# Patient Record
Sex: Female | Born: 1980
Health system: Southern US, Community
[De-identification: ages and names within clinical notes are randomized; demographics above are authoritative.]

## PROBLEM LIST (undated history)

## (undated) DIAGNOSIS — F419 Anxiety disorder, unspecified: Secondary | ICD-10-CM

## (undated) HISTORY — DX: Anxiety disorder, unspecified: F41.9

---

## 2014-01-10 DIAGNOSIS — O09521 Supervision of elderly multigravida, first trimester: Secondary | ICD-10-CM | POA: Insufficient documentation

## 2014-01-10 DIAGNOSIS — Z3491 Encounter for supervision of normal pregnancy, unspecified, first trimester: Secondary | ICD-10-CM | POA: Insufficient documentation

## 2014-01-10 DIAGNOSIS — Z87891 Personal history of nicotine dependence: Secondary | ICD-10-CM | POA: Insufficient documentation

## 2014-01-10 LAB — HM HIV SCREENING LAB: HM HIV Screening: NEGATIVE

## 2014-01-11 DIAGNOSIS — O26891 Other specified pregnancy related conditions, first trimester: Secondary | ICD-10-CM

## 2014-01-11 DIAGNOSIS — Z6791 Unspecified blood type, Rh negative: Secondary | ICD-10-CM | POA: Insufficient documentation

## 2015-10-11 DIAGNOSIS — Z01419 Encounter for gynecological examination (general) (routine) without abnormal findings: Secondary | ICD-10-CM | POA: Diagnosis not present

## 2015-10-11 DIAGNOSIS — R232 Flushing: Secondary | ICD-10-CM | POA: Diagnosis not present

## 2015-10-11 DIAGNOSIS — Z6827 Body mass index (BMI) 27.0-27.9, adult: Secondary | ICD-10-CM | POA: Diagnosis not present

## 2015-10-11 DIAGNOSIS — Z1151 Encounter for screening for human papillomavirus (HPV): Secondary | ICD-10-CM | POA: Diagnosis not present

## 2015-10-15 LAB — HM PAP SMEAR

## 2015-11-25 ENCOUNTER — Ambulatory Visit
Admission: EM | Admit: 2015-11-25 | Discharge: 2015-11-25 | Disposition: A | Discharge status: Home / Self Care | Payer: BLUE CROSS/BLUE SHIELD | Attending: Family Medicine | Admitting: Family Medicine

## 2015-11-25 ENCOUNTER — Encounter: Payer: Self-pay | Admitting: Gynecology

## 2015-11-25 DIAGNOSIS — R059 Cough, unspecified: Secondary | ICD-10-CM

## 2015-11-25 DIAGNOSIS — R05 Cough: Secondary | ICD-10-CM

## 2015-11-25 DIAGNOSIS — R112 Nausea with vomiting, unspecified: Secondary | ICD-10-CM

## 2015-11-25 LAB — RAPID STREP SCREEN (MED CTR MEBANE ONLY): STREPTOCOCCUS, GROUP A SCREEN (DIRECT): NEGATIVE

## 2015-11-25 LAB — RAPID INFLUENZA A&B ANTIGENS (ARMC ONLY): INFLUENZA A (ARMC): NEGATIVE

## 2015-11-25 LAB — RAPID INFLUENZA A&B ANTIGENS: Influenza B (ARMC): NEGATIVE

## 2015-11-25 MED ORDER — ONDANSETRON 8 MG PO TBDP
8.0000 mg | ORAL_TABLET | Freq: Once | ORAL | Status: AC
  Administered 2015-11-25: 8 mg via ORAL

## 2015-11-25 MED ORDER — ONDANSETRON 4 MG PO TBDP: 4.0000 mg | ORAL_TABLET | Freq: Three times a day (TID) | ORAL | 0 refills | Status: DC | PRN

## 2015-11-25 NOTE — ED Triage Notes (Signed)
Patient c/o 2-3 days sore throat. Per patient body aches and throws up today.

## 2015-11-25 NOTE — Discharge Instructions (Signed)
Use the zofran as directed.  If you continue to have nausea and vomiting please go to the ED.  Take care  Dr. Adriana Simasook

## 2015-11-25 NOTE — ED Provider Notes (Signed)
[facility]    CSN: [id number] Arrival date & time: 11/25/15  1521  History   Chief Complaint Chief Complaint  Patient presents with  . Generalized Body Aches   HPI 35 year old female presents with the planes of body aches, chills, nausea, vomiting. She also has cough.  Patient states that she's had a cough for the past 2 days. Her daughter has been sick. Her husband is also been sick with similar symptoms. She states that this morning she developed body aches, chills, and nausea/vomiting. She states that she feels generally poor. She states she's had 2 episodes of emesis. No other associated symptoms. No fever. Symptoms are severe. No other complaints at this time. They are concerned about influenza.  History reviewed. No pertinent past medical history.  History reviewed. No pertinent surgical history.  OB History    No data available     Home Medications    Prior to Admission medications   Medication Sig Start Date End Date Taking? Authorizing Provider  ondansetron (ZOFRAN-ODT) 4 MG disintegrating tablet Take 1 tablet (4 mg total) by mouth every 8 (eight) hours as needed for nausea or vomiting. 11/25/15   Tommie SamsJayce G Pansey Pinheiro, DO   Family History History reviewed. No pertinent family history.  Social History Social History  Substance Use Topics  . Smoking status: Never Smoker  . Smokeless tobacco: Never Used  . Alcohol use No   Allergies   Review of patient's allergies indicates no known allergies.  Review of Systems Review of Systems  Constitutional: Positive for chills and fatigue.  Respiratory: Positive for cough.   Gastrointestinal: Positive for nausea and vomiting.  Musculoskeletal: Positive for myalgias.  All other systems reviewed and are negative.  Physical Exam Triage Vital Signs ED Triage Vitals  Enc Vitals Group     BP 11/25/15 1543 98/60     Pulse Rate 11/25/15 1543 (!) 105     Resp 11/25/15 1543 16     Temp 11/25/15 1543 99.9 F (37.7 C)       Temp src --      SpO2 11/25/15 1543 99 %     Weight 11/25/15 1544 170 lb (77.1 kg)     Height 11/25/15 1544 5\' 6"  (1.676 m)     Head Circumference --      Peak Flow --      Pain Score --      Pain Loc --      Pain Edu? --      Excl. in GC? --    Updated Vital Signs BP 98/60 (BP Location: Left Arm)   Pulse (!) 105   Temp 99.9 F (37.7 C)   Resp 16   Ht 5\' 6"  (1.676 m)   Wt 170 lb (77.1 kg)   LMP 11/25/2015   SpO2 99%   BMI 27.44 kg/m   Physical Exam  Constitutional: She is oriented to person, place, and time.  Appears fatigued, pale. No acute respiratory distress.  HENT:  Head: Normocephalic and atraumatic.  Mouth/Throat: Oropharynx is clear and moist.  Eyes: Conjunctivae are normal.  Neck: Neck supple.  Cardiovascular: Regular rhythm.  Tachycardia present.   Pulmonary/Chest: Effort normal.  Abdominal: Soft. She exhibits no distension. There is no tenderness. There is no rebound and no guarding.  Musculoskeletal: Normal range of motion.  Neurological: She is alert and oriented to person, place, and time.  Skin: Skin is warm. No rash noted.  Psychiatric: She has a normal mood and affect.  Vitals  reviewed.  UC Treatments / Results  Labs (all labs ordered are listed, but only abnormal results are displayed) Labs Reviewed  RAPID STREP SCREEN (NOT AT [facility])  RAPID INFLUENZA A&B ANTIGENS ([facility] ONLY)  CULTURE, GROUP A STREP [facility])    EKG  EKG Interpretation None      Radiology No results found.  Procedures Procedures (including critical care time)  Medications Ordered in UC Medications  ondansetron (ZOFRAN-ODT) disintegrating tablet 8 mg (8 mg Oral Given 11/25/15 1603)   Initial Impression / Assessment and Plan / UC Course  I have reviewed the triage vital signs and the nursing notes.  Pertinent labs & imaging results that were available during my care of the patient were reviewed by me and considered in my medical decision making (see chart for  details).  Clinical Course  35 year old female presents with complaints of cough, body aches, chills, nausea/vomiting. Her husband and daughter also sick. This is likely viral in origin. Patient given Zofran today. Discharging home on Zofran. Advised to increase fluid intake. If she's unable to keep things down, she needs to go to ED for IV fluids.  Final Clinical Impressions(s) / UC Diagnoses   Final diagnoses:  Non-intractable vomiting with nausea, unspecified vomiting type  Cough   New Prescriptions Discharge Medication List as of 11/25/2015  4:19 PM    START taking these medications   Details  ondansetron (ZOFRAN-ODT) 4 MG disintegrating tablet Take 1 tablet (4 mg total) by mouth every 8 (eight) hours as needed for nausea or vomiting., Starting Sun 11/25/2015, Print         Tommie Sams, DO 11/25/15 1652

## 2015-11-28 ENCOUNTER — Telehealth: Payer: Self-pay | Admitting: Emergency Medicine

## 2015-11-28 LAB — CULTURE, GROUP A STREP (THRC)

## 2015-11-28 NOTE — Telephone Encounter (Signed)
Patient was notified of her throat culture result.  Patient states that she is feeling much.  Patient was instructed to follow-up here or with her PCP if her symptoms worsen.  Patient verbalized understanding.

## 2015-12-11 DIAGNOSIS — J209 Acute bronchitis, unspecified: Secondary | ICD-10-CM | POA: Diagnosis not present

## 2016-01-02 DIAGNOSIS — R05 Cough: Secondary | ICD-10-CM | POA: Diagnosis not present

## 2016-01-04 DIAGNOSIS — R05 Cough: Secondary | ICD-10-CM | POA: Diagnosis not present

## 2016-02-07 DIAGNOSIS — D485 Neoplasm of uncertain behavior of skin: Secondary | ICD-10-CM | POA: Diagnosis not present

## 2016-02-07 DIAGNOSIS — D1801 Hemangioma of skin and subcutaneous tissue: Secondary | ICD-10-CM | POA: Diagnosis not present

## 2016-02-07 DIAGNOSIS — L814 Other melanin hyperpigmentation: Secondary | ICD-10-CM | POA: Diagnosis not present

## 2016-02-07 DIAGNOSIS — L570 Actinic keratosis: Secondary | ICD-10-CM | POA: Diagnosis not present

## 2016-02-22 DIAGNOSIS — J069 Acute upper respiratory infection, unspecified: Secondary | ICD-10-CM | POA: Diagnosis not present

## 2016-04-29 DIAGNOSIS — Z79899 Other long term (current) drug therapy: Secondary | ICD-10-CM | POA: Diagnosis not present

## 2016-04-29 DIAGNOSIS — F419 Anxiety disorder, unspecified: Secondary | ICD-10-CM | POA: Diagnosis not present

## 2016-04-29 DIAGNOSIS — B351 Tinea unguium: Secondary | ICD-10-CM | POA: Diagnosis not present

## 2016-10-13 DIAGNOSIS — Z6829 Body mass index (BMI) 29.0-29.9, adult: Secondary | ICD-10-CM | POA: Diagnosis not present

## 2016-10-13 DIAGNOSIS — Z01419 Encounter for gynecological examination (general) (routine) without abnormal findings: Secondary | ICD-10-CM | POA: Diagnosis not present

## 2016-12-02 DIAGNOSIS — F419 Anxiety disorder, unspecified: Secondary | ICD-10-CM | POA: Diagnosis not present

## 2016-12-02 DIAGNOSIS — Z79899 Other long term (current) drug therapy: Secondary | ICD-10-CM | POA: Diagnosis not present

## 2017-03-03 ENCOUNTER — Encounter: Payer: Self-pay | Admitting: *Deleted

## 2017-03-03 ENCOUNTER — Other Ambulatory Visit: Payer: Self-pay

## 2017-03-03 ENCOUNTER — Ambulatory Visit
Admission: EM | Admit: 2017-03-03 | Discharge: 2017-03-03 | Disposition: A | Discharge status: Home / Self Care | Payer: BLUE CROSS/BLUE SHIELD | Attending: Family Medicine | Admitting: Family Medicine

## 2017-03-03 DIAGNOSIS — R05 Cough: Secondary | ICD-10-CM

## 2017-03-03 DIAGNOSIS — J069 Acute upper respiratory infection, unspecified: Secondary | ICD-10-CM

## 2017-03-03 DIAGNOSIS — J0100 Acute maxillary sinusitis, unspecified: Secondary | ICD-10-CM

## 2017-03-03 DIAGNOSIS — B9789 Other viral agents as the cause of diseases classified elsewhere: Secondary | ICD-10-CM | POA: Diagnosis not present

## 2017-03-03 LAB — RAPID STREP SCREEN (MED CTR MEBANE ONLY): Streptococcus, Group A Screen (Direct): NEGATIVE

## 2017-03-03 MED ORDER — FLUTICASONE PROPIONATE 50 MCG/ACT NA SUSP: 2.0000 | Freq: Every day | NASAL | 0 refills | Status: DC

## 2017-03-03 MED ORDER — HYDROCOD POLST-CPM POLST ER 10-8 MG/5ML PO SUER: 5.0000 mL | Freq: Two times a day (BID) | ORAL | 0 refills | Status: DC

## 2017-03-03 MED ORDER — AMOXICILLIN-POT CLAVULANATE 875-125 MG PO TABS: 1.0000 | ORAL_TABLET | Freq: Two times a day (BID) | ORAL | 0 refills | Status: DC

## 2017-03-03 MED ORDER — BENZONATATE 200 MG PO CAPS: ORAL_CAPSULE | ORAL | 0 refills | Status: DC

## 2017-03-03 NOTE — ED Provider Notes (Signed)
[facility]    CSN: [id number] Arrival date & time: 03/03/17  [id number]     History   Chief Complaint Chief Complaint  Patient presents with  . Sore Throat  . Cough  . Nasal Congestion      HPI [name] is a 37 y.o. female.   HPI  37 year old female presents with symptoms of sore throat cough nasal congestion and sinus pressure had  for 3 days.  He has been having sinus pressure with radiating upper dental pain.  She has been using Nettie pot and salt water rinses with mild relief.  She is unable to sleep last night because of the coughing persisted all night long.  Child is in daycare most likely the carrier of the illness.  Her daughter had just improved from croup last week.  She has a temperature of 99 degrees today.  O2 sats are 100% on room air.        History reviewed. No pertinent past medical history.  There are no active problems to display for this patient.   History reviewed. No pertinent surgical history.  OB History    No data available       Home Medications    Prior to Admission medications   Medication Sig Start Date End Date Taking? Authorizing Provider  amoxicillin-clavulanate (AUGMENTIN) 875-125 MG tablet Take 1 tablet by mouth every 12 (twelve) hours. 03/10/17   Lutricia Feiloemer, Tavarus Poteete P, PA-C  benzonatate (TESSALON) 200 MG capsule Take one cap TID PRN cough 03/03/17   Lutricia Feiloemer, Jaquilla Woodroof P, PA-C  chlorpheniramine-HYDROcodone [facility](TUSSIONEX PENNKINETIC ER) 10-8 MG/5ML SUER Take 5 mLs by mouth 2 (two) times daily. 03/03/17   Lutricia Feiloemer, Melbert Botelho P, PA-C  fluticasone (FLONASE) 50 MCG/ACT nasal spray Place 2 sprays into both nostrils daily. 03/03/17   Lutricia Feiloemer, Zadaya Cuadra P, PA-C    Family History Family History  Problem Relation Age of Onset  . Atrial fibrillation Father   . COPD Father     Social History Social History   Tobacco Use  . Smoking status: Never Smoker  . Smokeless tobacco: Never Used  Substance Use Topics  . Alcohol use: No  . Drug  use: No     Allergies   Patient has no known allergies.   Review of Systems Review of Systems  Constitutional: Positive for activity change, chills and fatigue.  HENT: Positive for congestion, postnasal drip, rhinorrhea, sinus pressure and sinus pain.   Respiratory: Positive for cough.   All other systems reviewed and are negative.    Physical Exam Triage Vital Signs ED Triage Vitals  Enc Vitals Group     BP 03/03/17 0951 105/69     Pulse Rate 03/03/17 0951 87     Resp 03/03/17 0951 18     Temp 03/03/17 0951 99 F (37.2 C)     Temp Source 03/03/17 0951 Oral     SpO2 03/03/17 0951 100 %     Weight 03/03/17 0952 170 lb (77.1 kg)     Height 03/03/17 0952 5\' 6"  (1.676 m)     Head Circumference --      Peak Flow --      Pain Score 03/03/17 0952 0     Pain Loc --      Pain Edu? --      Excl. in GC? --    No data found.  Updated Vital Signs BP 105/69 (BP Location: Left Arm)   Pulse 87   Temp 99 F (37.2 C) (Oral)  Resp 18   Ht 5\' 6"  (1.676 m)   Wt 170 lb (77.1 kg)   LMP 02/02/2017   SpO2 100%   BMI 27.44 kg/m   Visual Acuity Right Eye Distance:   Left Eye Distance:   Bilateral Distance:    Right Eye Near:   Left Eye Near:    Bilateral Near:     Physical Exam  Constitutional: She is oriented to person, place, and time. She appears well-developed and well-nourished.  HENT:  Head: Normocephalic.  Right Ear: Hearing, tympanic membrane and ear canal normal.  Left Ear: Hearing, tympanic membrane and ear canal normal.  Mouth/Throat: Uvula is midline, oropharynx is clear and moist and mucous membranes are normal. No oral lesions. No uvula swelling. No oropharyngeal exudate, posterior oropharyngeal edema, posterior oropharyngeal erythema or tonsillar abscesses. Tonsils are 0 on the right. Tonsils are 0 on the left. No tonsillar exudate.  There is moderate tenderness over the maxillary sinuses bilaterally.  Eyes: Pupils are equal, round, and reactive to light.    Neck: Normal range of motion.  Pulmonary/Chest: Effort normal and breath sounds normal.  Lymphadenopathy:    She has no cervical adenopathy.  Neurological: She is alert and oriented to person, place, and time.  Skin: Skin is warm and dry.  Psychiatric: She has a normal mood and affect. Her behavior is normal.  Nursing note and vitals reviewed.    UC Treatments / Results  Labs (all labs ordered are listed, but only abnormal results are displayed) Labs Reviewed  RAPID STREP SCREEN (NOT AT [facility])  CULTURE, GROUP A STREP [facility])    EKG  EKG Interpretation None       Radiology No results found.  Procedures Procedures (including critical care time)  Medications Ordered in UC Medications - No data to display   Initial Impression / Assessment and Plan / UC Course  I have reviewed the triage vital signs and the nursing notes.  Pertinent labs & imaging results that were available during my care of the patient were reviewed by me and considered in my medical decision making (see chart for details).     Plan: 1. Test/x-ray results and diagnosis reviewed with patient 2. rx as per orders; risks, benefits, potential side effects reviewed with patient 3. Recommend supportive treatment with rest and fluids.  Continue with Nettie pot and Flonase use.  Discontinue Lorazepam at nighttime for sleep but instead substitute with the Tussionex.  Will prescribe Augmentin for sinusitis in 7 days if she is not improving. 4. F/u prn if symptoms worsen or don't improve   Final Clinical Impressions(s) / UC Diagnoses   Final diagnoses:  Acute maxillary sinusitis, recurrence not specified  Upper respiratory tract infection, unspecified type  Viral URI with cough    ED Discharge Orders        Ordered    amoxicillin-clavulanate (AUGMENTIN) 875-125 MG tablet  Every 12 hours     03/03/17 1052    benzonatate (TESSALON) 200 MG capsule     03/03/17 1052    fluticasone (FLONASE) 50 MCG/ACT  nasal spray  Daily     03/03/17 1052    chlorpheniramine-HYDROcodone (TUSSIONEX PENNKINETIC ER) 10-8 MG/5ML SUER  2 times daily     03/03/17 1052       Controlled Substance Prescriptions [place] Controlled Substance Registry consulted? Not Applicable   Lutricia Feil, PA-C 03/03/17 2012

## 2017-03-03 NOTE — ED Triage Notes (Signed)
PAtient started having symptoms of core throat, cough, and nasal congestion 3 days ago.

## 2017-03-03 NOTE — Medical Student Note (Signed)
[facility] Provider Student Note For educational purposes for Medical, PA and NP students only and not part of the legal medical record.   CSN: [id number] Arrival date & time: 03/03/17  [id number]     History   Chief Complaint Chief Complaint  Patient presents with  . Sore Throat  . Cough  . Nasal Congestion    HPI Laura Dodson is a 37 y.o. female.  HPI  37 year old white female presents with chest congestion, clear productive cough, frontal headache, sinus pressure with radiating upper dental pain, sore throat x 3 days. Denies ear pain, visual changes, fever/chills, N/V/D, bowel changes, urinary symptoms, abdominal pain. Patient's daughter had croup 1 week ago and goes to daycare. Did sinus rinses and salt gargle rinses with mild relief. Has not tried OTC cold/flu meds or pain relievers. Has trouble sleeping at night from nasal congestion and took a 0.25 mg Lorazepam for sleeping aid. She has this prescription from her former PCP whose practice has closed and pt is looking for a new PCP. Did not receive influenza vaccine this year.   History reviewed. No pertinent past medical history.  There are no active problems to display for this patient.   History reviewed. No pertinent surgical history.  OB History    No data available       Home Medications    Prior to Admission medications   Medication Sig Start Date End Date Taking? Authorizing Provider  ondansetron (ZOFRAN-ODT) 4 MG disintegrating tablet Take 1 tablet (4 mg total) by mouth every 8 (eight) hours as needed for nausea or vomiting. 11/25/15   Tommie Samsook, Jayce G, DO    Family History Family History  Problem Relation Age of Onset  . Atrial fibrillation Father   . COPD Father     Social History Social History   Tobacco Use  . Smoking status: Never Smoker  . Smokeless tobacco: Never Used  Substance Use Topics  . Alcohol use: No  . Drug use: No     Allergies   Patient has no known  allergies.   Review of Systems Review of Systems  Constitutional: Negative for chills, fatigue and fever.  HENT: Positive for congestion, rhinorrhea, sinus pressure and sore throat. Negative for ear discharge, ear pain, sinus pain and trouble swallowing.   Eyes: Negative.   Respiratory: Positive for cough and chest tightness. Negative for shortness of breath, wheezing and stridor.   Cardiovascular: Negative for chest pain and palpitations.  Gastrointestinal: Negative for abdominal pain, constipation, diarrhea, nausea and vomiting.  Genitourinary: Negative for frequency and urgency.  Musculoskeletal: Negative for arthralgias and myalgias.  Skin: Negative for rash.  Neurological: Positive for headaches. Negative for weakness.  Hematological: Negative for adenopathy.     Physical Exam Updated Vital Signs BP 105/69 (BP Location: Left Arm)   Pulse 87   Temp 99 F (37.2 C) (Oral)   Resp 18   Ht 5\' 6"  (1.676 m)   Wt 77.1 kg   LMP 02/02/2017   SpO2 100%   BMI 27.44 kg/m   Physical Exam  Constitutional: Vital signs are normal. She appears well-developed and well-nourished.  Non-toxic appearance. No distress.  HENT:  Head: Normocephalic and atraumatic.  Right Ear: Tympanic membrane and ear canal normal.  Left Ear: Tympanic membrane and ear canal normal.  Mouth/Throat: Uvula is midline and mucous membranes are normal. No oral lesions. No uvula swelling. Posterior oropharyngeal erythema present. No oropharyngeal exudate, posterior oropharyngeal edema or tonsillar abscesses. Tonsils  are 0 on the right. Tonsils are 0 on the left. No tonsillar exudate.  Mild frontal and maxillary tenderness to palpation   Eyes: Conjunctivae are normal.  Neck: Neck supple.  Cardiovascular: Normal rate, regular rhythm, normal heart sounds and intact distal pulses.  No murmur heard. Pulmonary/Chest: Effort normal and breath sounds normal. No stridor. No respiratory distress. She has no wheezes. She has no  rhonchi. She has no rales. She exhibits no tenderness.  Abdominal: Soft. There is no tenderness.  Musculoskeletal: She exhibits no edema.  Lymphadenopathy:    She has no cervical adenopathy.  Neurological: She is alert.  Skin: Skin is warm and dry.  Psychiatric: She has a normal mood and affect.  Nursing note and vitals reviewed.    ED Treatments / Results  Labs (all labs ordered are listed, but only abnormal results are displayed) Labs Reviewed  RAPID STREP SCREEN (NOT AT [facility])  CULTURE, GROUP A STREP [facility])    EKG  EKG Interpretation None       Radiology No results found.  Procedures Procedures (including critical care time)  Medications Ordered in ED Medications - No data to display   Initial Impression / Assessment and Plan / ED Course  I have reviewed the triage vital signs and the nursing notes.  Pertinent labs & imaging results that were available during my care of the patient were reviewed by me and considered in my medical decision making (see chart for details).    37 year old white non-tachycardic, afebrile, non-toxic appearing female presents with clear productive cough, sinus pressure, and congestion x 3 days. Signs and symptoms are consistent with a viral URI and could be an early developing bacterial sinusitis. Prescribed Tessalon Perles, Flonase, Tussionex, and OTC Mucinex. Continue sinus rinses and NSAIDs for pain relief. Will prescribe Augmentin 500 mg BID for her to fill if symptoms do not improve within the next 10 days. Patient agrees with plan.    Final Clinical Impressions(s) / ED Diagnoses   Final diagnoses:  None    New Prescriptions New Prescriptions   No medications on file

## 2017-03-06 LAB — CULTURE, GROUP A STREP (THRC)

## 2017-03-11 DIAGNOSIS — B356 Tinea cruris: Secondary | ICD-10-CM | POA: Diagnosis not present

## 2017-03-11 DIAGNOSIS — L820 Inflamed seborrheic keratosis: Secondary | ICD-10-CM | POA: Diagnosis not present

## 2017-03-27 ENCOUNTER — Ambulatory Visit
Admission: EM | Admit: 2017-03-27 | Discharge: 2017-03-27 | Disposition: A | Discharge status: Home / Self Care | Payer: BLUE CROSS/BLUE SHIELD | Attending: Family Medicine | Admitting: Family Medicine

## 2017-03-27 DIAGNOSIS — Z20828 Contact with and (suspected) exposure to other viral communicable diseases: Secondary | ICD-10-CM | POA: Diagnosis not present

## 2017-03-27 DIAGNOSIS — Z1159 Encounter for screening for other viral diseases: Secondary | ICD-10-CM

## 2017-03-27 LAB — RAPID INFLUENZA A&B ANTIGENS (ARMC ONLY)
INFLUENZA A (ARMC): NEGATIVE
INFLUENZA B (ARMC): NEGATIVE

## 2017-03-27 MED ORDER — OSELTAMIVIR PHOSPHATE 75 MG PO CAPS: 75.0000 mg | ORAL_CAPSULE | Freq: Every day | ORAL | 0 refills | Status: AC

## 2017-03-27 NOTE — ED Triage Notes (Signed)
Pt was in direct contact with her sister and eating after her and she was diagnosed with the flu and would like a flu test. She has no symptoms at all.

## 2017-03-27 NOTE — Discharge Instructions (Signed)
Take medication as prescribed. Rest. Drink plenty of fluids.  ° °Follow up with your primary care physician this week as needed. Return to Urgent care for new or worsening concerns.  ° °

## 2017-03-27 NOTE — ED Provider Notes (Signed)
[facility] ____________________________________________  Time seen: Approximately 738 PM  I have reviewed the triage vital signs and the nursing notes.   HISTORY  Chief Complaint Influenza   HPI Laura Dodson is a 37 y.o. female presenting for evaluation of influenza. States she shared a salad yesterday with her sister in law that tested positive for influenza today, though remains asymptomatic. States her sister in law was tested as her son was sick. Patient reports that she currently feels fine. Denies cough, congestion, fevers, body aches, or other complaints. States wants preventative tamiflu if not sick.   Denies chest pain, shortness of breath, abdominal pain, or rash. Denies recent sickness. Denies recent antibiotic use. Denies renal insufficiency.   Patient's last menstrual period was 03/27/2017 (exact date).denies pregnancy.    History reviewed. No pertinent past medical history.  There are no active problems to display for this patient.   History reviewed. No pertinent surgical history.   No current facility-administered medications for this encounter.   Current Outpatient Medications:  .  amoxicillin-clavulanate (AUGMENTIN) 875-125 MG tablet, Take 1 tablet by mouth every 12 (twelve) hours., Disp: 20 tablet, Rfl: 0 .  benzonatate (TESSALON) 200 MG capsule, Take one cap TID PRN cough, Disp: 30 capsule, Rfl: 0 .  chlorpheniramine-HYDROcodone (TUSSIONEX PENNKINETIC ER) 10-8 MG/5ML SUER, Take 5 mLs by mouth 2 (two) times daily., Disp: 115 mL, Rfl: 0 .  fluticasone (FLONASE) 50 MCG/ACT nasal spray, Place 2 sprays into both nostrils daily., Disp: 16 g, Rfl: 0 .  oseltamivir (TAMIFLU) 75 MG capsule, Take 1 capsule (75 mg total) by mouth daily for 10 days., Disp: 10 capsule, Rfl: 0  Allergies Patient has no known allergies.  Family History  Problem Relation Age of Onset  . Atrial fibrillation Father   . COPD Father     Social History Social History    Tobacco Use  . Smoking status: Never Smoker  . Smokeless tobacco: Never Used  Substance Use Topics  . Alcohol use: No  . Drug use: No    Review of Systems Constitutional: No fever/chills ENT: No sore throat. Cardiovascular: Denies chest pain. Respiratory: Denies shortness of breath. Gastrointestinal: No abdominal pain.   Musculoskeletal: Negative for back pain. Skin: Negative for rash.   ____________________________________________   PHYSICAL EXAM:  VITAL SIGNS: ED Triage Vitals  Enc Vitals Group     BP 03/27/17 1850 (!) 120/54     Pulse Rate 03/27/17 1850 67     Resp 03/27/17 1850 18     Temp 03/27/17 1850 98.6 F (37 C)     Temp Source 03/27/17 1850 Oral     SpO2 03/27/17 1850 100 %     Weight --      Height --      Head Circumference --      Peak Flow --      Pain Score 03/27/17 1852 0     Pain Loc --      Pain Edu? --      Excl. in GC? --     Constitutional: Alert and oriented. Well appearing and in no acute distress. Eyes: Conjunctivae are normal. Head: Atraumatic. No sinus tenderness to palpation. No swelling. No erythema.  Ears: no erythema, normal TMs bilaterally.   Nose:No nasal congestion  Mouth/Throat: Mucous membranes are moist. No pharyngeal erythema. No tonsillar swelling or exudate.  Neck: No stridor.  No cervical spine tenderness to palpation. Hematological/Lymphatic/Immunilogical: No cervical lymphadenopathy. Cardiovascular: Normal rate, regular rhythm. Grossly normal heart sounds.  Good peripheral circulation. Respiratory: Normal respiratory effort.  No retractions. No wheezes, rales or rhonchi. Good air movement.  Musculoskeletal: Ambulatory with steady gait. Neurologic:  Normal speech and language. No gait instability. Skin:  Skin appears warm, dry. Psychiatric: Mood and affect are normal. Speech and behavior are normal.  ___________________________________________   LABS (all labs ordered are listed, but only abnormal results are  displayed)  Labs Reviewed  RAPID INFLUENZA A&B ANTIGENS ([facility] ONLY)   ____________________________________________   PROCEDURES Procedures    INITIAL IMPRESSION / ASSESSMENT AND PLAN / ED COURSE  Pertinent labs & imaging results that were available during my care of the patient were reviewed by me and considered in my medical decision making (see chart for details).  Well-appearing patient.  No acute distress.  Influenza test negative. Discussed typical viral transmission and symptomatic time periods. Will start on oral tamiflu, counseled if symptomatic to adjust dose to bid. Encouraged supportive care.   Discussed follow up with Primary care physician this week. Discussed follow up and return parameters including no resolution or any worsening concerns. Patient verbalized understanding and agreed to plan.   ____________________________________________   FINAL CLINICAL IMPRESSION(S) / ED DIAGNOSES  Final diagnoses:  Exposure to influenza     ED Discharge Orders        Ordered    oseltamivir (TAMIFLU) 75 MG capsule  Daily     03/27/17 1928       Note: This dictation was prepared with Dragon dictation along with smaller phrase technology. Any transcriptional errors that result from this process are unintentional.         Renford Dills, NP 03/27/17 2018

## 2017-03-30 ENCOUNTER — Telehealth: Payer: Self-pay

## 2017-03-30 NOTE — Telephone Encounter (Signed)
Called to follow up with patient since visit here at [facility]. Spoke with pt, reports improvement.Patient instructed to call back with any questions or concerns. MAH  

## 2017-04-20 DIAGNOSIS — D485 Neoplasm of uncertain behavior of skin: Secondary | ICD-10-CM | POA: Diagnosis not present

## 2017-04-20 DIAGNOSIS — L814 Other melanin hyperpigmentation: Secondary | ICD-10-CM | POA: Diagnosis not present

## 2017-05-25 ENCOUNTER — Ambulatory Visit: Payer: BLUE CROSS/BLUE SHIELD | Admitting: Nurse Practitioner

## 2017-05-25 ENCOUNTER — Ambulatory Visit
Admission: RE | Admit: 2017-05-25 | Discharge: 2017-05-25 | Disposition: A | Discharge status: Home / Self Care | Payer: BLUE CROSS/BLUE SHIELD | Source: Ambulatory Visit | Attending: Nurse Practitioner | Admitting: Nurse Practitioner

## 2017-05-25 ENCOUNTER — Encounter: Payer: Self-pay | Admitting: Nurse Practitioner

## 2017-05-25 ENCOUNTER — Other Ambulatory Visit: Payer: Self-pay

## 2017-05-25 VITALS — BP 108/69 | HR 59 | Temp 98.4°F | Ht 65.0 in | Wt 169.8 lb

## 2017-05-25 DIAGNOSIS — G8929 Other chronic pain: Secondary | ICD-10-CM | POA: Diagnosis not present

## 2017-05-25 DIAGNOSIS — Z7689 Persons encountering health services in other specified circumstances: Secondary | ICD-10-CM | POA: Diagnosis not present

## 2017-05-25 DIAGNOSIS — M546 Pain in thoracic spine: Secondary | ICD-10-CM | POA: Insufficient documentation

## 2017-05-25 DIAGNOSIS — R0781 Pleurodynia: Secondary | ICD-10-CM | POA: Diagnosis not present

## 2017-05-25 DIAGNOSIS — F419 Anxiety disorder, unspecified: Secondary | ICD-10-CM

## 2017-05-25 MED ORDER — HYDROXYZINE HCL 25 MG PO TABS: 12.5000 mg | ORAL_TABLET | Freq: Three times a day (TID) | ORAL | 1 refills | Status: DC | PRN

## 2017-05-25 NOTE — Patient Instructions (Addendum)
Leafy RoJessica Jagger,   Thank you for coming in to clinic today.  1. For your nail, continue using yuour tea tree oil or you can use Res-Q ointment from [employer].    2. For your back aching - Start taking Tylenol extra strength 1 to 2 tablets every 6-8 hours for aches or fever/chills for next few days as needed.  Do not take more than 3,000 mg in 24 hours from all medicines.  May take Ibuprofen as well if tolerated 200-400mg  every 8 hours as needed. May alternate tylenol and ibuprofen in same day. - Use heat and ice.  Apply this for 15 minutes at a time 6-8 times per day.   - Muscle rub with lidocaine, lidocaine patch, Biofreeze, or tiger balm for topical pain relief.  Avoid using this with heat and ice to avoid burns.  Please schedule a follow-up appointment with Wilhelmina McardleLauren Kyana Aicher, AGNP. Return in about 3 months (around 08/24/2017) for anxiety.  If you have any other questions or concerns, please feel free to call the clinic or send a message through MyChart. You may also schedule an earlier appointment if necessary.  You will receive a survey after today's visit either digitally by e-mail or paper by [employer] mail. Your experiences and feedback matter to us.  Please respond so we know how we are doing as we provide care for you.   Wilhelmina McardleLauren Najae Filsaime, DNP, AGNP-BC Adult Gerontology Nurse Practitioner [facility], [facility]

## 2017-05-25 NOTE — Progress Notes (Signed)
Subjective:    Patient ID: Laura Dodson, female    DOB: 15-Mar-1980, 37 y.o.   MRN: [id number]  Laura Dodson is a 37 y.o. female presenting on 05/25/2017 for Establish Care   HPI  Establish Care New Provider Pt [facility] - pt has her own records with her today Dermatology screening hx melanoma - receives regularly.   Anxiety Occasional panic, but is more overwhelming anxiety.  She notices this more in very loud, busy, chaotic environments and usually also with PMDD.   - Mild ADD - real estate exam x 3 times before passing.  - Has random fear in early 20s and had Zoloft x 3 months and had sensation of "feeling like she is in her own little bubble." - Family history of anxiety.  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 Took in Nov for pre-cursor to flare, February - took 1 also.  None since.  Requests refill today.  Thoracic Back pain Pt notes left-sided thoracic back pain near ribs. She notes no injury, but may have hurt it coughing.  She is not getting adequate relief with tylenol or ibuprofen currently.    Past Medical History:  Diagnosis Date  . Anxiety    History reviewed. No pertinent surgical history. Social History   Socioeconomic History  . Marital status: Married    Spouse name: Not on file  . Number of children: Not on file  . Years of education: Not on file  . Highest education level: Associate degree: occupational, Scientist, product/process developmenttechnical, or vocational program  Occupational History  . Not on file  Social Needs  . Financial resource strain: Not on file  . Food insecurity:    Worry: Not on file    Inability: Not on file  . Transportation needs:    Medical: Not on file    Non-medical: Not on file  Tobacco Use  . Smoking status: Former Smoker    Packs/day: 0.50    Years: 15.00    Pack years: 7.50    Last attempt to quit: 2012    Years since quitting: 7.3  . Smokeless  tobacco: Never Used  Substance and Sexual Activity  . Alcohol use: Yes    Comment: 2-3  . Drug use: No  . Sexual activity: Not on file  Lifestyle  . Physical activity:    Days per week: Not on file    Minutes per session: Not on file  . Stress: Not at all  Relationships  . Social connections:    Talks on phone: Not on file    Gets together: Not on file    Attends religious service: Not on file    Active member of club or organization: Not on file    Attends meetings of clubs or organizations: Not on file    Relationship status: Not on file  . Intimate partner violence:    Fear of current or ex partner: No    Emotionally abused: No    Physically abused: No    Forced sexual activity: No  Other Topics Concern  . Not on file  Social History Narrative  . Not on file   Family History  Problem Relation Age of Onset  . Healthy Mother   . Squamous cell carcinoma Mother   . Atrial fibrillation Father   . COPD Father   . Heart failure Father   . Healthy Brother   . Healthy  Daughter   . Squamous cell carcinoma Maternal Aunt   . Melanoma Maternal Grandmother   . Lung cancer Maternal Grandmother   . Cancer Paternal Grandfather   . Healthy Brother    Current Outpatient Medications on File Prior to Visit  Medication Sig  . valACYclovir (VALTREX) 500 MG tablet Take 500 mg by mouth 2 (two) times daily as needed.  . fluticasone (FLONASE) 50 MCG/ACT nasal spray Place 2 sprays into both nostrils daily. (Patient not taking: Reported on 05/25/2017)   No current facility-administered medications on file prior to visit.     Review of Systems  Constitutional: Negative.   HENT: Negative.   Eyes: Negative.   Respiratory: Negative.   Cardiovascular: Negative.   Gastrointestinal: Negative.   Endocrine: Negative.   Genitourinary: Negative.   Musculoskeletal: Negative.   Skin: Negative.   Allergic/Immunologic: Negative.   Neurological: Negative.   Hematological: Negative.     Psychiatric/Behavioral: The patient is nervous/anxious.   All other systems reviewed and are negative.  Per HPI unless specifically indicated above     Objective:    BP 108/69 (BP Location: Right Arm, Patient Position: Sitting, Cuff Size: Normal)   Pulse (!) 59   Temp 98.4 F (36.9 C) (Oral)   Ht 5\' 5"  (1.651 m)   Wt 169 lb 12.8 oz (77 kg)   LMP 05/19/2017   BMI 28.26 kg/m   Wt Readings from Last 3 Encounters:  05/25/17 169 lb 12.8 oz (77 kg)  03/03/17 170 lb (77.1 kg)  11/25/15 170 lb (77.1 kg)    Physical Exam  Constitutional: She is oriented to person, place, and time. She appears well-developed and well-nourished. No distress.  HENT:  Head: Normocephalic and atraumatic.  Cardiovascular: Normal rate, regular rhythm, S1 normal, S2 normal, normal heart sounds and intact distal pulses.  Pulmonary/Chest: Effort normal and breath sounds normal. No respiratory distress.  Musculoskeletal:  Thoracic Back Inspection: Normal appearance, no spinal deformity, symmetrical. Palpation: No tenderness over spinous processes. Bilateral thoracic paraspinal muscles non tender and without hypertonicity/spasm. ROM: Full active ROM forward flex / back extension, rotation L/R without discomfort Special Testing: Seated SLR negative for radicular pain bilaterally.  Strength: Bilateral hip flex/ext 5/5, knee flex/ext 5/5, ankle dorsiflex/plantarflex 5/5 Neurovascular: intact distal sensation to light touch   Neurological: She is alert and oriented to person, place, and time.  Skin: Skin is warm and dry.  Third digit nail left foot with thickening and discoloration.  Psychiatric: She has a normal mood and affect. Her behavior is normal. Judgment and thought content normal. Her speech is rapid and/or pressured. Cognition and memory are normal. She expresses no homicidal and no suicidal ideation. She expresses no suicidal plans and no homicidal plans. She is communicative.  Vitals reviewed.    Results for orders placed or performed during the hospital encounter of 03/27/17  Rapid Influenza A&B Antigens ([facility] only)  Result Value Ref Range   Influenza A ([facility]) NEGATIVE NEGATIVE   Influenza B ([facility]) NEGATIVE NEGATIVE      Assessment & Plan:   Problem List Items Addressed This Visit    None    Visit Diagnoses    Anxiety    -  Primary Pt presents with s/sx of panic disorder with social anxiety.  Pt admits she also has ADHD but no psychology evaluation.  Plan: 1. Recommended stress management. 2. START hydroxyzine 25 mg tablet tid prn anxiety/panic. 3. Consider psychology eval if needing treatment for ADHD.  Defer for now and manage  anxiety. 4. Followup 3 months.   Relevant Medications   hydrOXYzine (ATARAX/VISTARIL) 25 MG tablet   Chronic left-sided thoracic back pain     No known etiology.  Pt is concerned for rib fracture or other bony abnormality.  Requests Xrays today.  Cannot duplicate pain with palpation today.  Likely self-limited muscle strain.  Plan: 1. Thoracic spine and rib Xrays without fracture. 2. Treat with OTC pain meds (acetaminophen and ibuprofen).  Discussed alternate dosing and max dosing. 3. Apply heat and/or ice to affected area. 4. May also apply a muscle rub with lidocaine or lidocaine patch after heat or ice. 5. Follow up 4-6 weeks prn    Relevant Orders   DG Thoracic Spine 2 View (Completed)   DG Ribs Unilateral Left (Completed)   Encounter to establish care     Past medical, family, and surgical history reviewed w/ pt.  Records reviewed.        Meds ordered this encounter  Medications  . hydrOXYzine (ATARAX/VISTARIL) 25 MG tablet    Sig: Take 0.5-1 tablets (12.5-25 mg total) by mouth 3 (three) times daily as needed for anxiety.    Dispense:  30 tablet    Refill:  1    Order Specific Question:   Supervising Provider    Answer:   Smitty Cords [[id number]]     Follow up plan: Return in about 3 months (around 08/24/2017) for  anxiety.  Wilhelmina Mcardle, DNP, AGPCNP-BC Adult Gerontology Primary Care Nurse Practitioner [facility] 06/17/2017, 8:46 PM

## 2017-08-31 ENCOUNTER — Encounter: Payer: Self-pay | Admitting: Nurse Practitioner

## 2017-09-07 ENCOUNTER — Other Ambulatory Visit: Payer: Self-pay

## 2017-09-07 ENCOUNTER — Ambulatory Visit (INDEPENDENT_AMBULATORY_CARE_PROVIDER_SITE_OTHER): Payer: BLUE CROSS/BLUE SHIELD | Admitting: Nurse Practitioner

## 2017-09-07 ENCOUNTER — Encounter: Payer: Self-pay | Admitting: Nurse Practitioner

## 2017-09-07 VITALS — BP 92/59 | HR 64 | Temp 98.4°F | Ht 65.0 in | Wt 161.2 lb

## 2017-09-07 DIAGNOSIS — F410 Panic disorder [episodic paroxysmal anxiety] without agoraphobia: Secondary | ICD-10-CM | POA: Diagnosis not present

## 2017-09-07 DIAGNOSIS — S90222A Contusion of left lesser toe(s) with damage to nail, initial encounter: Secondary | ICD-10-CM

## 2017-09-07 DIAGNOSIS — B351 Tinea unguium: Secondary | ICD-10-CM | POA: Diagnosis not present

## 2017-09-07 MED ORDER — LORAZEPAM 0.5 MG PO TABS
0.5000 mg | ORAL_TABLET | Freq: Every evening | ORAL | 5 refills | Status: DC | PRN
Start: 2017-09-07 — End: 2018-05-04

## 2017-09-07 NOTE — Progress Notes (Signed)
Subjective:    Patient ID: Laura Dodson, female    DOB: 1980-12-20, 37 y.o.   MRN: [id number]  Laura Dodson is a 37 y.o. female presenting on 09/07/2017 for Toe Injury (left 3rd toe injury x 1 yrs ago. Now a blood blister x  2 weeks )   HPI Fungal nail Injured left 3rd toenail 1 year ago.  Has had fungal nail appearance chronically since that time.  Approximately 3 weeks ago, patient had pedicure.  Since that time, nail has been very painful to mild trauma, is lifting some, has blood beneath nail near nail cuticle, and is not improving.  Would like resolution.  Situational anxiety and panic - has used about 1 per month. This month has been more stressful, so has used 2 this month.  Is concerned for running out and presents today to renew prescription that is currently expired. - Has tried Xanax and Clonopin in past with significant drowsiness and less action on anxiety. - Lorazepam helps significantly for calming and is well tolerated without side effects. Has used 1/2 dose in past, but half doesn't have good effect on symptoms. - Patient takes this when she gets very irritable, agitated, annoyance that is "difficult to shake."  Overwhelming sense of being "short fused/short-tempered."  Racing thoughts/rumination occur when self help strategies are used and patient knows she will have panic attack if she doesn't take lorazepam.  Self help strategies include: 2 minutes to escape a situation and calm down (self talk, deep breathing), running for 30 minutes or 20 minute workout.  Patient admits she likely has mild ADD also.  It is at these times that she feels most overloaded and cannot continue to function when she has higher levels of anxiety. - No panic attack in many years, but uses Lorazepam to stop these from occurring.  She was having anxiety attacks frequently in her 28s.  GAD 7 : Generalized Anxiety Score 09/07/2017  Nervous, Anxious, on Edge 0  Control/stop worrying 0  Worry too much -  different things 0  Trouble relaxing 3  Restless 0  Easily annoyed or irritable 0  Afraid - awful might happen 0  Total GAD 7 Score 3  Anxiety Difficulty Not difficult at all   Depression screen [facility] 2/9 09/07/2017 05/25/2017  Decreased Interest 0 0  Down, Depressed, Hopeless 0 0  PHQ - 2 Score 0 0  Altered sleeping 0 -  Tired, decreased energy 0 -  Change in appetite 0 -  Feeling bad or failure about yourself  0 -  Trouble concentrating 3 -  Moving slowly or fidgety/restless 0 -  Suicidal thoughts 0 -  PHQ-9 Score 3 -  Difficult doing work/chores Not difficult at all -    Social History   Tobacco Use  . Smoking status: Former Smoker    Packs/day: 0.50    Years: 15.00    Pack years: 7.50    Last attempt to quit: 2012    Years since quitting: 7.5  . Smokeless tobacco: Never Used  Substance Use Topics  . Alcohol use: Yes    Comment: 2-3  . Drug use: No    Review of Systems  Constitutional: Negative for fatigue and unexpected weight change.  Respiratory: Negative for shortness of breath.   Cardiovascular: Negative for chest pain and palpitations.  Gastrointestinal: Negative for abdominal pain, constipation, diarrhea, nausea and vomiting.  Endocrine: Negative for polydipsia.  Musculoskeletal: Negative for back pain and myalgias.  Skin: Negative.   Neurological:  Negative for dizziness, seizures and headaches.  Psychiatric/Behavioral: Positive for agitation and decreased concentration. Negative for hallucinations, self-injury, sleep disturbance and suicidal ideas. The patient is nervous/anxious (intermittently).    Per HPI unless specifically indicated above     Objective:    BP (!) 92/59 (BP Location: Right Arm, Patient Position: Sitting, Cuff Size: Normal)   Pulse 64   Temp 98.4 F (36.9 C) (Oral)   Ht 5\' 5"  (1.651 m)   Wt 161 lb 3.2 oz (73.1 kg)   BMI 26.83 kg/m   Wt Readings from Last 3 Encounters:  09/07/17 161 lb 3.2 oz (73.1 kg)  05/25/17 169 lb 12.8 oz  (77 kg)  03/03/17 170 lb (77.1 kg)    Physical Exam  Constitutional: She is oriented to person, place, and time. She appears well-developed and well-nourished. No distress.  HENT:  Head: Normocephalic and atraumatic.  Cardiovascular: Normal rate, regular rhythm, S1 normal, S2 normal, normal heart sounds and intact distal pulses.  Pulmonary/Chest: Effort normal and breath sounds normal. No respiratory distress.  Musculoskeletal:       Feet:  Neurological: She is alert and oriented to person, place, and time.  Skin: Skin is warm and dry. Capillary refill takes less than 2 seconds.  Psychiatric: She has a normal mood and affect. Her behavior is normal. Judgment and thought content normal.  Vitals reviewed.       Results for orders placed or performed during the hospital encounter of 03/27/17  Rapid Influenza A&B Antigens ([facility] only)  Result Value Ref Range   Influenza A ([facility]) NEGATIVE NEGATIVE   Influenza B ([facility]) NEGATIVE NEGATIVE      Assessment & Plan:   Problem List Items Addressed This Visit    None    Visit Diagnoses    Fungal nail infection    -  Primary   Relevant Orders   Ambulatory referral to Podiatry   Subungual hematoma of toe of left foot, initial encounter       Relevant Orders   Ambulatory referral to Podiatry   Severe anxiety with panic       Relevant Medications   LORazepam (ATIVAN) 0.5 MG tablet      # Fungal nail infection and subungual hematoma third toe left foot Stable, chronic fungal nail infection with hematoma below nail on third toe near cuticle.  Appeared after pedicure and is likely related to that cosmetic procedure with cuticle clipping.  Remains painful and bothersome to patient. - Referral to podiatry.  No acute process that requires treatment today in clinic.  Consider release of hematoma vs removal of nail plate was discussed as possible option with patient.  Patient would like to preserve nail ability to regrow if possible.  Await  further recommendations or other options from podiatrist. - Advised patient to avoid cuticle removal at any future pedicures/manicures to prevent injuries and infections. - Followup as needed  # Anxiety Stable chronic anxiety with panic attacks.  Patient uses lorazepam sparingly for halting of anxiety that would lead to panic attack.  Patient is well adapted with self care strategies and non-pharm measures to prevent panic and control anxiety. - Continue self care regularly. - Continue lorazepam 0.5 mg daily prn for anxiety and panic attack. - Followup 6 months.  Meds ordered this encounter  Medications  . LORazepam (ATIVAN) 0.5 MG tablet    Sig: Take 1 tablet (0.5 mg total) by mouth at bedtime as needed for anxiety.    Dispense:  15 tablet  Refill:  5    Order Specific Question:   Supervising Provider    Answer:   Smitty CordsKARAMALEGOS, ALEXANDER J [[id number]]    Follow up plan: Return in about 6 months (around 03/10/2018) for annual physical.  Laura McardleLauren Gwendolen Hewlett, DNP, AGPCNP-BC Adult Gerontology Primary Care Nurse Practitioner [facility] 09/07/2017, 12:15 PM

## 2017-09-07 NOTE — Patient Instructions (Addendum)
Laura Dodson,   Thank you for coming in to clinic today.  1. Continue lorazepam 0.5 mg once daily as needed for anxiety. - Followup 6 months.  2. Referral to [facility] for your toe treatment.  They will call you.  Please schedule a follow-up appointment with Wilhelmina McardleLauren Letica Giaimo, AGNP. Return in about 6 months (around 03/10/2018) for annual physical.  If you have any other questions or concerns, please feel free to call the clinic or send a message through MyChart. You may also schedule an earlier appointment if necessary.  You will receive a survey after today's visit either digitally by e-mail or paper by [employer] mail. Your experiences and feedback matter to us.  Please respond so we know how we are doing as we provide care for you.   Wilhelmina McardleLauren Mykeal Carrick, DNP, AGNP-BC Adult Gerontology Nurse Practitioner [facility], [facility]

## 2017-09-23 ENCOUNTER — Encounter: Payer: Self-pay | Admitting: Podiatry

## 2017-09-23 ENCOUNTER — Ambulatory Visit: Payer: BLUE CROSS/BLUE SHIELD | Admitting: Podiatry

## 2017-09-23 DIAGNOSIS — B351 Tinea unguium: Secondary | ICD-10-CM | POA: Diagnosis not present

## 2017-09-23 DIAGNOSIS — L603 Nail dystrophy: Secondary | ICD-10-CM

## 2017-09-23 NOTE — Progress Notes (Signed)
  Subjective:  Patient ID: Laura Dodson, female    DOB: [birth year],  MRN: [id number] HPI Chief Complaint  Patient presents with  . Nail Problem    Patient presents today with nail fungus of 3rd left toe x 1 year.  She reports at times it becomes inflammed and very sore around nail bed.  She states she has been using vicks vapor rub the past several days which has helped with the redness and pain    37 y.o. female presents with the above complaint.   ROS: Denies fever chills nausea vomiting muscle aches and pains.  Past Medical History:  Diagnosis Date  . Anxiety    No past surgical history on file.  Current Outpatient Medications:  .  fluticasone (FLONASE) 50 MCG/ACT nasal spray, Place 2 sprays into both nostrils daily., Disp: 16 g, Rfl: 0 .  LORazepam (ATIVAN) 0.5 MG tablet, Take 1 tablet (0.5 mg total) by mouth at bedtime as needed for anxiety., Disp: 15 tablet, Rfl: 5 .  valACYclovir (VALTREX) 500 MG tablet, Take 500 mg by mouth 2 (two) times daily as needed., Disp: , Rfl:   No Known Allergies Review of Systems Objective:  There were no vitals filed for this visit.  General: Well developed, nourished, in no acute distress, alert and oriented x3   Dermatological: Skin is warm, dry and supple bilateral. Nails x 10 are well maintained; remaining integument appears unremarkable at this time. There are no open sores, no preulcerative lesions, no rash or signs of infection present.  Thick dystrophic possibly mycotic nail third digit left foot.  Vascular: Dorsalis Pedis artery and Posterior Tibial artery pedal pulses are 2/4 bilateral with immedate capillary fill time. Pedal hair growth present. No varicosities and no lower extremity edema present bilateral.   Neruologic: Grossly intact via light touch bilateral. Vibratory intact via tuning fork bilateral. Protective threshold with Semmes Wienstein monofilament intact to all pedal sites bilateral. Patellar and Achilles deep tendon  reflexes 2+ bilateral. No Babinski or clonus noted bilateral.   Musculoskeletal: No gross boney pedal deformities bilateral. No pain, crepitus, or limitation noted with foot and ankle range of motion bilateral. Muscular strength 5/5 in all groups tested bilateral.  Gait: Unassisted, Nonantalgic.    Radiographs:  None taken  Assessment & Plan:   Assessment: Nail dystrophy third toe left foot  Plan: Samples of the nail were taken today to be sent for pathologic evaluation.     Max T. [place], [place]

## 2017-10-05 ENCOUNTER — Telehealth: Payer: Self-pay | Admitting: Podiatry

## 2017-10-05 NOTE — Telephone Encounter (Signed)
I spoke with patient and informed her that Dr. Al CorpusHyatt would like her to come into office to discuss [place] results before giving medication.  She verbalized understanding and was sent to scheduling for sooner appt than 11/04/17

## 2017-10-05 NOTE — Telephone Encounter (Signed)
Patient called and wanted to know if she can start using medication for her toes since her appointment isn't until 11/04/17

## 2017-10-12 DIAGNOSIS — R102 Pelvic and perineal pain: Secondary | ICD-10-CM | POA: Diagnosis not present

## 2017-10-12 DIAGNOSIS — Z6826 Body mass index (BMI) 26.0-26.9, adult: Secondary | ICD-10-CM | POA: Diagnosis not present

## 2017-10-20 DIAGNOSIS — Z6826 Body mass index (BMI) 26.0-26.9, adult: Secondary | ICD-10-CM | POA: Diagnosis not present

## 2017-10-20 DIAGNOSIS — Z01419 Encounter for gynecological examination (general) (routine) without abnormal findings: Secondary | ICD-10-CM | POA: Diagnosis not present

## 2017-11-04 ENCOUNTER — Encounter: Payer: Self-pay | Admitting: Podiatry

## 2017-11-04 ENCOUNTER — Ambulatory Visit: Payer: BLUE CROSS/BLUE SHIELD | Admitting: Podiatry

## 2017-11-04 DIAGNOSIS — Z79899 Other long term (current) drug therapy: Secondary | ICD-10-CM

## 2017-11-04 MED ORDER — TERBINAFINE HCL 250 MG PO TABS: 250.0000 mg | ORAL_TABLET | Freq: Every day | ORAL | 0 refills | Status: DC

## 2017-11-04 NOTE — Progress Notes (Signed)
She presents today for follow-up of her nail fungus.  States that she is doing quite well but would like her toe to look better.  Objective: Pathology report demonstrates a saprophytic infection.  Assessment: Onychomycosis.  Plan: Discussed the pros and cons of oral therapy today she would like to go ahead and start oral therapy Lamisil 250 mg tablets 1 p.o. daily x30 and requested a liver profile.  I will follow-up with her in 1 month.  We discussed the possible side effects and reactions she is to notify us with any concerns.

## 2017-11-05 LAB — HEPATIC FUNCTION PANEL
ALT: 14 IU/L (ref 0–32)
AST: 24 IU/L (ref 0–40)
Albumin: 4.6 g/dL (ref 3.5–5.5)
Alkaline Phosphatase: 46 IU/L (ref 39–117)
BILIRUBIN TOTAL: 0.5 mg/dL (ref 0.0–1.2)
BILIRUBIN, DIRECT: 0.14 mg/dL (ref 0.00–0.40)
TOTAL PROTEIN: 7.2 g/dL (ref 6.0–8.5)

## 2017-11-06 ENCOUNTER — Telehealth: Payer: Self-pay | Admitting: *Deleted

## 2017-11-06 NOTE — Telephone Encounter (Signed)
-----   Message from Elinor ParkinsonMax T Hyatt, [place] sent at 11/05/2017  6:44 AM EDT ----- Blood work looks perfect and may continue mediction

## 2017-11-06 NOTE — Telephone Encounter (Signed)
Left message informing pt of Dr. Hyatt's review of results and orders. 

## 2017-11-23 DIAGNOSIS — Z6826 Body mass index (BMI) 26.0-26.9, adult: Secondary | ICD-10-CM | POA: Diagnosis not present

## 2017-11-23 DIAGNOSIS — Z1289 Encounter for screening for malignant neoplasm of other sites: Secondary | ICD-10-CM | POA: Diagnosis not present

## 2017-11-25 ENCOUNTER — Other Ambulatory Visit: Payer: Self-pay

## 2017-11-25 ENCOUNTER — Ambulatory Visit
Admission: EM | Admit: 2017-11-25 | Discharge: 2017-11-25 | Disposition: A | Discharge status: Home / Self Care | Payer: BLUE CROSS/BLUE SHIELD | Attending: Family Medicine | Admitting: Family Medicine

## 2017-11-25 ENCOUNTER — Encounter: Payer: Self-pay | Admitting: Emergency Medicine

## 2017-11-25 DIAGNOSIS — J029 Acute pharyngitis, unspecified: Secondary | ICD-10-CM | POA: Diagnosis not present

## 2017-11-25 DIAGNOSIS — R05 Cough: Secondary | ICD-10-CM | POA: Diagnosis not present

## 2017-11-25 DIAGNOSIS — J069 Acute upper respiratory infection, unspecified: Secondary | ICD-10-CM | POA: Diagnosis not present

## 2017-11-25 LAB — RAPID STREP SCREEN (MED CTR MEBANE ONLY): STREPTOCOCCUS, GROUP A SCREEN (DIRECT): NEGATIVE

## 2017-11-25 MED ORDER — HYDROCOD POLST-CPM POLST ER 10-8 MG/5ML PO SUER: 5.0000 mL | Freq: Every evening | ORAL | 0 refills | Status: DC | PRN

## 2017-11-25 MED ORDER — BENZONATATE 100 MG PO CAPS: 100.0000 mg | ORAL_CAPSULE | Freq: Three times a day (TID) | ORAL | 0 refills | Status: DC | PRN

## 2017-11-25 NOTE — Discharge Instructions (Addendum)
Take medication as prescribed. Rest. Drink plenty of fluids.  ° °Follow up with your primary care physician this week as needed. Return to Urgent care for new or worsening concerns.  ° °

## 2017-11-25 NOTE — ED Provider Notes (Signed)
[facility] ____________________________________________  Time seen: Approximately 12:11 PM  I have reviewed the triage vital signs and the nursing notes.   HISTORY  Chief Complaint Sore Throat (APPT) and Cough   HPI Laura Dodson is a 37 y.o. female presenting for evaluation of runny nose, nasal congestion, sore throat and a cough that started on Monday.  Reports low-grade fevers, T-max 99.1 this morning.  States that she did take Tylenol prior to arrival.  States her daughter has been sick in the last few days, diagnosed with croup this past weekend.  States sore throat feels very raw and irritating.  Nasal congestion, with cough.  States cough does intermittently disrupt sleep.  States cough is productive of yellowish-green sputum.  Denies hemoptysis.  No complaint chest pain, shortness of breath, abdominal pain, extremity pain or joint swelling.  Has been taking over-the-counter Tylenol, Echinacea.  Denies other aggravating alleviating factors.  Denies recent sickness.  Reports otherwise feels well.  Denies current pregnancy.  Galen Manila, NP: PCP  Past Medical History:  Diagnosis Date  . Anxiety     Patient Active Problem List   Diagnosis Date Noted  . Rh negative status during pregnancy in first trimester, antepartum 01/11/2014  . Former smoker 01/10/2014  . Multigravida of advanced maternal age in first trimester 01/10/2014  . Supervision of normal pregnancy in first trimester 01/10/2014    History reviewed. No pertinent surgical history.   No current facility-administered medications for this encounter.   Current Outpatient Medications:  .  LORazepam (ATIVAN) 0.5 MG tablet, Take 1 tablet (0.5 mg total) by mouth at bedtime as needed for anxiety., Disp: 15 tablet, Rfl: 5 .  terbinafine (LAMISIL) 250 MG tablet, Take 1 tablet (250 mg total) by mouth daily., Disp: 30 tablet, Rfl: 0 .  valACYclovir (VALTREX) 500 MG tablet, Take 500 mg by mouth 2 (two)  times daily as needed., Disp: , Rfl:  .  benzonatate (TESSALON PERLES) 100 MG capsule, Take 1 capsule (100 mg total) by mouth 3 (three) times daily as needed for cough., Disp: 15 capsule, Rfl: 0 .  chlorpheniramine-HYDROcodone (TUSSIONEX PENNKINETIC ER) 10-8 MG/5ML SUER, Take 5 mLs by mouth at bedtime as needed for cough. do not drive or operate machinery while taking as can cause drowsiness., Disp: 50 mL, Rfl: 0  Allergies Patient has no known allergies.  Family History  Problem Relation Age of Onset  . Healthy Mother   . Squamous cell carcinoma Mother   . Atrial fibrillation Father   . COPD Father   . Heart failure Father   . Healthy Brother   . Healthy Daughter   . Squamous cell carcinoma Maternal Aunt   . Melanoma Maternal Grandmother   . Lung cancer Maternal Grandmother   . Cancer Paternal Grandfather   . Healthy Brother     Social History Social History   Tobacco Use  . Smoking status: Former Smoker    Packs/day: 0.50    Years: 15.00    Pack years: 7.50    Last attempt to quit: 2012    Years since quitting: 7.7  . Smokeless tobacco: Never Used  Substance Use Topics  . Alcohol use: Yes    Comment: 2-3  . Drug use: No    Review of Systems Constitutional: Possible subjective fevers.  ENT: positive sore throat. Cardiovascular: Denies chest pain. Respiratory: Denies shortness of breath. Gastrointestinal: No abdominal pain.   Musculoskeletal: Negative for back pain. Skin: Negative for rash.  ____________________________________________   PHYSICAL  EXAM:  VITAL SIGNS: ED Triage Vitals  Enc Vitals Group     BP 11/25/17 1105 104/64     Pulse Rate 11/25/17 1105 76     Resp 11/25/17 1105 16     Temp 11/25/17 1105 98.7 F (37.1 C)     Temp Source 11/25/17 1105 Oral     SpO2 11/25/17 1105 99 %     Weight 11/25/17 1106 155 lb (70.3 kg)     Height 11/25/17 1106 5\' 6"  (1.676 m)     Head Circumference --      Peak Flow --      Pain Score 11/25/17 1106 7      Pain Loc --      Pain Edu? --      Excl. in GC? --     Constitutional: Alert and oriented. Well appearing and in no acute distress. Eyes: Conjunctivae are normal. PERRL.  Head: Atraumatic. No sinus tenderness to palpation. No swelling. No erythema.  Ears: no erythema, normal TMs bilaterally.   Nose:Nasal congestion with clear rhinorrhea  Mouth/Throat: Mucous membranes are moist. Mild pharyngeal erythema. No tonsillar swelling or exudate.  Neck: No stridor.  No cervical spine tenderness to palpation. Hematological/Lymphatic/Immunilogical: No cervical lymphadenopathy. Cardiovascular: Normal rate, regular rhythm. Grossly normal heart sounds.  Good peripheral circulation. Respiratory: Normal respiratory effort.  No retractions. No wheezes, rales or rhonchi. Good air movement.  Musculoskeletal: Ambulatory with steady gait.  Neurologic:  Normal speech and language. No gait instability. Skin:  Skin appears warm, dry and intact. No rash noted. Psychiatric: Mood and affect are normal. Speech and behavior are normal.  ___________________________________________   LABS (all labs ordered are listed, but only abnormal results are displayed)  Labs Reviewed  RAPID STREP SCREEN ([facility] ONLY)  CULTURE, GROUP A STREP [facility])    PROCEDURES Procedures     INITIAL IMPRESSION / ASSESSMENT AND PLAN / ED COURSE  Pertinent labs & imaging results that were available during my care of the patient were reviewed by me and considered in my medical decision making (see chart for details).  Well-appearing patient.  No acute distress.  Quick strep negative, will culture.  Suspect viral upper respiratory infection with cough.  Discussed over-the-counter Sudafed use, will Rx PRN Tessalon Perls and PRN Keflex.  Encourage rest, fluids, supportive care.Discussed indication, risks and benefits of medications with patient.  Discussed follow up with Primary care physician this week as needed. Discussed  follow up and return parameters including no resolution or any worsening concerns. Patient verbalized understanding and agreed to plan.   ____________________________________________   FINAL CLINICAL IMPRESSION(S) / ED DIAGNOSES  Final diagnoses:  Upper respiratory tract infection, unspecified type     ED Discharge Orders         Ordered    chlorpheniramine-HYDROcodone (TUSSIONEX PENNKINETIC ER) 10-8 MG/5ML SUER  At bedtime PRN     11/25/17 1151    benzonatate (TESSALON PERLES) 100 MG capsule  3 times daily PRN     11/25/17 1151           Note: This dictation was prepared with Dragon dictation along with smaller phrase technology. Any transcriptional errors that result from this process are unintentional.         Renford Dills, NP 11/25/17 1216

## 2017-11-25 NOTE — ED Triage Notes (Signed)
Patient in today c/o sore throat, productive cough (green) and nasal congestion. Patient has had chills, fever of 99.1. Patient has had body aches as well. Patient has been taking Tylenol and hot showers.

## 2017-11-28 LAB — CULTURE, GROUP A STREP (THRC)

## 2017-11-30 ENCOUNTER — Ambulatory Visit: Payer: BLUE CROSS/BLUE SHIELD | Admitting: Podiatry

## 2017-12-23 ENCOUNTER — Ambulatory Visit: Payer: BLUE CROSS/BLUE SHIELD | Admitting: Podiatry

## 2017-12-23 ENCOUNTER — Encounter: Payer: Self-pay | Admitting: Podiatry

## 2017-12-23 ENCOUNTER — Encounter

## 2017-12-23 DIAGNOSIS — L603 Nail dystrophy: Secondary | ICD-10-CM

## 2017-12-23 DIAGNOSIS — Z79899 Other long term (current) drug therapy: Secondary | ICD-10-CM | POA: Diagnosis not present

## 2017-12-23 MED ORDER — TERBINAFINE HCL 250 MG PO TABS: 250.0000 mg | ORAL_TABLET | Freq: Every day | ORAL | 0 refills | Status: DC

## 2017-12-23 NOTE — Progress Notes (Signed)
She presents today for follow-up of her nail fungus.  She states that she had stopped taking the Lamisil for a few days because of stomach upset but has gone on to start and nearly complete the regimen.  She denies fever chills nausea vomiting muscle aches pains calf pain back pain chest pain shortness of breath other than a short period of nauseousness that she had prior.  She states that her toenail no longer hurts.  Objective: No change in the toenails as of yet in regards to the thickness and growth out.  Assessment: Onychomycosis long-term therapy with Lamisil.  Plan: Continue use of the Lamisil therapy request a liver profile and I will follow-up with her in 4 months we dispensed another prescription for 3 months of Lamisil.

## 2018-03-10 ENCOUNTER — Other Ambulatory Visit: Payer: Self-pay

## 2018-03-10 ENCOUNTER — Ambulatory Visit (INDEPENDENT_AMBULATORY_CARE_PROVIDER_SITE_OTHER): Payer: BLUE CROSS/BLUE SHIELD | Admitting: Nurse Practitioner

## 2018-03-10 ENCOUNTER — Encounter: Payer: Self-pay | Admitting: Nurse Practitioner

## 2018-03-10 VITALS — BP 100/59 | HR 57 | Temp 98.2°F | Ht 66.0 in | Wt 170.6 lb

## 2018-03-10 DIAGNOSIS — H538 Other visual disturbances: Secondary | ICD-10-CM | POA: Diagnosis not present

## 2018-03-10 DIAGNOSIS — Z0000 Encounter for general adult medical examination without abnormal findings: Secondary | ICD-10-CM | POA: Diagnosis not present

## 2018-03-10 DIAGNOSIS — R000 Tachycardia, unspecified: Secondary | ICD-10-CM | POA: Diagnosis not present

## 2018-03-10 DIAGNOSIS — Z131 Encounter for screening for diabetes mellitus: Secondary | ICD-10-CM

## 2018-03-10 NOTE — Patient Instructions (Addendum)
Laura Dodson,   Thank you for coming in to clinic today.  1. Continue work toward healthy lifestyle. - Keep target HR for your workout at 80% of your max which will be 145-158 beats per minute.    2.  You will be due for FASTING BLOOD WORK.  This means you should eat no food or drink after midnight.  Drink only water or coffee without cream/sugar Dodson the morning of your lab visit. - Please go ahead and schedule a "Lab Only" visit in the morning at the clinic for lab draw today. - Your results will be available about 2-3 days after blood draw.  If you have set up a MyChart account, you can can log in to MyChart online to view your results and a brief explanation. Also, we can discuss your results together at your next office visit if you would like.   Please schedule a follow-up appointment with Wilhelmina Mcardle, AGNP. Return in about 1 year (around 03/11/2019) for annual physical.  If you have any other questions or concerns, please feel free to call the clinic or send a message through MyChart. You may also schedule an earlier appointment if necessary.  You will receive a survey after today's visit either digitally by e-mail or paper by [employer]. Your experiences and feedback matter to [place].  Please respond so we know how we are doing as we provide care for you.  Wilhelmina Mcardle, DNP, AGNP-BC Adult Gerontology Nurse Practitioner [facility], [facility]

## 2018-03-10 NOTE — Progress Notes (Signed)
Subjective:    Patient ID: Laura Dodson, female    DOB: 02/11/1981, 38 y.o.   MRN: [id number]  Laura Dodson is a 38 y.o. female presenting on 03/10/2018 for Annual Exam and Migraine (vision headache, kaleidscope vision x 2 times only lasting about 15 minutes )   HPI Annual Physical Exam Patient has been feeling well.  They have acute concerns today. Sleeps 5 hours per night uninterrupted.  HEALTH MAINTENANCE: Weight/BMI: overweight  - has already reduced 5 lbs since new year Physical activity: new commitment to exercise, healthy eating Diet: had increased carbs, sweets during holidays.   Seatbelt: always Sunscreen: regularly PAP: last in 2015 - due 2020 with OB-GYN Mammogram: not due HIV: neg in past Optometry: up to date Dentistry: regular  VACCINES: Tetanus: 2015 Influenza: declines  Vision changes Patient with kaleidoscope vision.  Never had headache/pain with symptoms. Had done a 5K a couple months ago to complete the race.  Then had kaleidoscope vision for about 15-30 minutes.  Has been to optometrist and has started using readers.  Again after workout Monday, had kaleidoscope vision about 30 minutes after workout for 30 minutes.   Was drinking water, had no other symptoms.  - Brother has had similar symptoms with migraine aura without pain. - Racing HR occurred, but possibly due to anxiousness.  Notes she had some symptoms of racing HR after workout. Last HR after cool down was still in 130s.  Usually getting HR max of 170s-180 and cool down to 130s before leaving.  Resting HR usually low.   Past Medical History:  Diagnosis Date  . Anxiety    No past surgical history on file. Social History   Socioeconomic History  . Marital status: Married    Spouse name: Not on file  . Number of children: Not on file  . Years of education: Not on file  . Highest education level: Associate degree: occupational, Scientist, product/process development, or vocational program  Occupational History  . Not on  file  Social Needs  . Financial resource strain: Not on file  . Food insecurity:    Worry: Not on file    Inability: Not on file  . Transportation needs:    Medical: Not on file    Non-medical: Not on file  Tobacco Use  . Smoking status: Former Smoker    Packs/day: 0.50    Years: 15.00    Pack years: 7.50    Last attempt to quit: 2012    Years since quitting: 8.0  . Smokeless tobacco: Never Used  Substance and Sexual Activity  . Alcohol use: Yes    Comment: 2-3  . Drug use: No  . Sexual activity: Not on file  Lifestyle  . Physical activity:    Days per week: Not on file    Minutes per session: Not on file  . Stress: Not at all  Relationships  . Social connections:    Talks on phone: Not on file    Gets together: Not on file    Attends religious service: Not on file    Active member of club or organization: Not on file    Attends meetings of clubs or organizations: Not on file    Relationship status: Not on file  . Intimate partner violence:    Fear of current or ex partner: No    Emotionally abused: No    Physically abused: No    Forced sexual activity: No  Other Topics Concern  . Not on file  Social History Narrative  . Not on file   Family History  Problem Relation Age of Onset  . Healthy Mother   . Squamous cell carcinoma Mother   . Atrial fibrillation Father   . COPD Father   . Heart failure Father   . Healthy Brother   . Healthy Daughter   . Squamous cell carcinoma Maternal Aunt   . Melanoma Maternal Grandmother   . Lung cancer Maternal Grandmother   . Cancer Paternal Grandfather   . Healthy Brother    Current Outpatient Medications on File Prior to Visit  Medication Sig  . ELDERBERRY PO Take by mouth.  Marland Kitchen. LORazepam (ATIVAN) 0.5 MG tablet Take 1 tablet (0.5 mg total) by mouth at bedtime as needed for anxiety.  . Multiple Vitamin (MULTIVITAMIN) tablet Take 1 tablet by mouth daily.  . valACYclovir (VALTREX) 500 MG tablet Take 500 mg by mouth 2 (two)  times daily as needed.  . terbinafine (LAMISIL) 250 MG tablet Take 1 tablet (250 mg total) by mouth daily. (Patient not taking: Reported on 03/10/2018)   No current facility-administered medications on file prior to visit.     Review of Systems Per HPI unless specifically indicated above     Objective:    BP (!) 100/59 (BP Location: Right Arm, Patient Position: Sitting, Cuff Size: Normal)   Pulse (!) 57   Temp 98.2 F (36.8 C) (Oral)   Ht 5\' 6"  (1.676 m)   Wt 170 lb 9.6 oz (77.4 kg)   LMP 03/04/2018   BMI 27.54 kg/m   Wt Readings from Last 3 Encounters:  03/10/18 170 lb 9.6 oz (77.4 kg)  11/25/17 155 lb (70.3 kg)  09/07/17 161 lb 3.2 oz (73.1 kg)    Physical Exam Vitals signs and nursing note reviewed.  Constitutional:      General: She is not in acute distress.    Appearance: She is well-developed.  HENT:     Head: Normocephalic and atraumatic.     Right Ear: External ear normal.     Left Ear: External ear normal.     Nose: Nose normal.  Eyes:     Conjunctiva/sclera: Conjunctivae normal.     Pupils: Pupils are equal, round, and reactive to light.  Neck:     Musculoskeletal: Normal range of motion and neck supple.     Thyroid: No thyromegaly.     Vascular: No JVD.     Trachea: No tracheal deviation.  Cardiovascular:     Rate and Rhythm: Normal rate and regular rhythm.     Heart sounds: Normal heart sounds. No murmur. No friction rub. No gallop.   Pulmonary:     Effort: Pulmonary effort is normal. No respiratory distress.     Breath sounds: Normal breath sounds.  Chest:     Comments: Breast - Normal exam w/ symmetric breasts, no mass, no nipple discharge, no skin changes or tenderness. Abdominal:     General: Bowel sounds are normal. There is no distension.     Palpations: Abdomen is soft.     Tenderness: There is no abdominal tenderness.  Musculoskeletal: Normal range of motion.  Lymphadenopathy:     Cervical: No cervical adenopathy.  Skin:    General: Skin  is warm and dry.  Neurological:     Mental Status: She is alert and oriented to person, place, and time.     Cranial Nerves: No cranial nerve deficit.  Psychiatric:        Behavior: Behavior normal.  Thought Content: Thought content normal.        Judgment: Judgment normal.    Results for orders placed or performed in visit on 03/10/18  TSH  Result Value Ref Range   TSH 1.77 mIU/L  Lipid panel  Result Value Ref Range   Cholesterol 177 <200 mg/dL   HDL 68 >24 mg/dL   Triglycerides 71 <462 mg/dL   LDL Cholesterol (Calc) 93 mg/dL (calc)   Total CHOL/HDL Ratio 2.6 <5.0 (calc)   Non-HDL Cholesterol (Calc) 109 <130 mg/dL (calc)  Hemoglobin M6N  Result Value Ref Range   Hgb A1c MFr Bld 5.0 <5.7 % of total Hgb   Mean Plasma Glucose 97 (calc)   eAG (mmol/L) 5.4 (calc)  COMPLETE METABOLIC PANEL WITH GFR  Result Value Ref Range   Glucose, Bld 88 65 - 99 mg/dL   BUN 14 7 - 25 mg/dL   Creat 8.17 7.11 - 6.57 mg/dL   GFR, Est Non African American 94 > OR = 60 mL/min/1.69m2   GFR, Est African American 109 > OR = 60 mL/min/1.52m2   BUN/Creatinine Ratio NOT APPLICABLE 6 - 22 (calc)   Sodium 138 135 - 146 mmol/L   Potassium 4.4 3.5 - 5.3 mmol/L   Chloride 104 98 - 110 mmol/L   CO2 27 20 - 32 mmol/L   Calcium 9.2 8.6 - 10.2 mg/dL   Total Protein 7.0 6.1 - 8.1 g/dL   Albumin 4.3 3.6 - 5.1 g/dL   Globulin 2.7 1.9 - 3.7 g/dL (calc)   AG Ratio 1.6 1.0 - 2.5 (calc)   Total Bilirubin 0.4 0.2 - 1.2 mg/dL   Alkaline phosphatase (APISO) 36 33 - 115 U/L   AST 26 10 - 30 U/L   ALT 17 6 - 29 U/L  CBC with Differential/Platelet  Result Value Ref Range   WBC 4.4 3.8 - 10.8 Thousand/uL   RBC 4.14 3.80 - 5.10 Million/uL   Hemoglobin 13.1 11.7 - 15.5 g/dL   HCT 90.3 83.3 - 38.3 %   MCV 94.7 80.0 - 100.0 fL   MCH 31.6 27.0 - 33.0 pg   MCHC 33.4 32.0 - 36.0 g/dL   RDW 29.1 91.6 - 60.6 %   Platelets 242 140 - 400 Thousand/uL   MPV 10.4 7.5 - 12.5 fL   Neutro Abs 2,345 1,500 - 7,800 cells/uL    Lymphs Abs 1,641 850 - 3,900 cells/uL   Absolute Monocytes 304 200 - 950 cells/uL   Eosinophils Absolute 79 15 - 500 cells/uL   Basophils Absolute 31 0 - 200 cells/uL   Neutrophils Relative % 53.3 %   Total Lymphocyte 37.3 %   Monocytes Relative 6.9 %   Eosinophils Relative 1.8 %   Basophils Relative 0.7 %      Assessment & Plan:   Problem List Items Addressed This Visit    None    Visit Diagnoses    Encounter for annual physical exam    -  Primary Annual physical exam with no new findings.  Well adult with acute concerns about tachycardia per below.  Plan: 1. Obtain health maintenance screenings as above according to age. - Increase physical activity to 30 minutes most days of the week.  - Eat healthy diet high in vegetables and fruits; low in refined carbohydrates. - Labs as ordered above 2. Return 1 year for annual physical.    Relevant Orders   TSH (Completed)   Lipid panel (Completed)   Hemoglobin A1c (Completed)   COMPLETE METABOLIC  PANEL WITH GFR (Completed)   CBC with Differential/Platelet (Completed)   Diabetes mellitus screening       Relevant Orders   Hemoglobin A1c (Completed)   Blurry vision, bilateral       Relevant Orders   Ambulatory referral to Cardiology   Exercise-induced tachycardia     Patient with exercise induced tachycardia and blurry vision x 2 over last several months. I suspect SVT due to over-exertion.  Patient describes pushing to near max HR for every workout up to 170 bpm for about 5-10 mins, which is no longer advised for her age.   - Discussed may need exercise stress test to capture possible arrhythmia.  Referral placed to cardiology. - Recommend patient reduce target HR to 80% of age-recommended max 145-158 bpm. - Follow-up prn if symptoms return or worsen.   Relevant Orders   Ambulatory referral to Cardiology      No orders of the defined types were placed in this encounter.  Follow up plan: Return in about 1 year (around  03/11/2019) for annual physical.  Wilhelmina Mcardle, DNP, AGPCNP-BC Adult Gerontology Primary Care Nurse Practitioner [facility] 03/10/2018, 9:17 AM

## 2018-03-11 LAB — HEMOGLOBIN A1C
Hgb A1c MFr Bld: 5 % of total Hgb (ref <5.7)
Mean Plasma Glucose: 97 (calc)
eAG (mmol/L): 5.4 (calc)

## 2018-03-11 LAB — CBC WITH DIFFERENTIAL/PLATELET
Absolute Monocytes: 304 cells/uL (ref 200–950)
Basophils Absolute: 31 cells/uL (ref 0–200)
Basophils Relative: 0.7 %
Eosinophils Absolute: 79 cells/uL (ref 15–500)
Eosinophils Relative: 1.8 %
HCT: 39.2 % (ref 35.0–45.0)
Hemoglobin: 13.1 g/dL (ref 11.7–15.5)
Lymphs Abs: 1641 cells/uL (ref 850–3900)
MCH: 31.6 pg (ref 27.0–33.0)
MCHC: 33.4 g/dL (ref 32.0–36.0)
MCV: 94.7 fL (ref 80.0–100.0)
MPV: 10.4 fL (ref 7.5–12.5)
Monocytes Relative: 6.9 %
Neutro Abs: 2345 cells/uL (ref 1500–7800)
Neutrophils Relative %: 53.3 %
Platelets: 242 10*3/uL (ref 140–400)
RBC: 4.14 10*6/uL (ref 3.80–5.10)
RDW: 12.2 % (ref 11.0–15.0)
Total Lymphocyte: 37.3 %
WBC: 4.4 10*3/uL (ref 3.8–10.8)

## 2018-03-11 LAB — COMPLETE METABOLIC PANEL WITH GFR
AG Ratio: 1.6 (calc) (ref 1.0–2.5)
ALT: 17 U/L (ref 6–29)
AST: 26 U/L (ref 10–30)
Albumin: 4.3 g/dL (ref 3.6–5.1)
Alkaline phosphatase (APISO): 36 U/L (ref 33–115)
BUN: 14 mg/dL (ref 7–25)
CO2: 27 mmol/L (ref 20–32)
Calcium: 9.2 mg/dL (ref 8.6–10.2)
Chloride: 104 mmol/L (ref 98–110)
Creat: 0.79 mg/dL (ref 0.50–1.10)
GFR, Est African American: 109 mL/min/{1.73_m2} (ref >=60)
GFR, Est Non African American: 94 mL/min/{1.73_m2} (ref >=60)
Globulin: 2.7 g/dL (calc) (ref 1.9–3.7)
Glucose, Bld: 88 mg/dL (ref 65–99)
Potassium: 4.4 mmol/L (ref 3.5–5.3)
Sodium: 138 mmol/L (ref 135–146)
Total Bilirubin: 0.4 mg/dL (ref 0.2–1.2)
Total Protein: 7 g/dL (ref 6.1–8.1)

## 2018-03-11 LAB — LIPID PANEL
Cholesterol: 177 mg/dL (ref <200)
HDL: 68 mg/dL (ref >50)
LDL Cholesterol (Calc): 93 mg/dL (calc)
Non-HDL Cholesterol (Calc): 109 mg/dL (calc) (ref <130)
Total CHOL/HDL Ratio: 2.6 (calc) (ref <5.0)
Triglycerides: 71 mg/dL (ref <150)

## 2018-03-11 LAB — TSH: TSH: 1.77 mIU/L

## 2018-03-16 ENCOUNTER — Other Ambulatory Visit: Payer: Self-pay

## 2018-03-16 DIAGNOSIS — Z0000 Encounter for general adult medical examination without abnormal findings: Secondary | ICD-10-CM

## 2018-03-16 NOTE — Addendum Note (Signed)
Addended by: Vernard Gambles on: 03/16/2018 03:47 PM   Modules accepted: Level of Service

## 2018-03-23 ENCOUNTER — Ambulatory Visit: Payer: BLUE CROSS/BLUE SHIELD | Admitting: Cardiovascular Disease

## 2018-03-23 ENCOUNTER — Encounter: Payer: Self-pay | Admitting: Cardiovascular Disease

## 2018-03-23 VITALS — BP 108/78 | HR 61 | Ht 66.0 in | Wt 169.8 lb

## 2018-03-23 DIAGNOSIS — Z87891 Personal history of nicotine dependence: Secondary | ICD-10-CM

## 2018-03-23 DIAGNOSIS — H539 Unspecified visual disturbance: Secondary | ICD-10-CM | POA: Insufficient documentation

## 2018-03-23 DIAGNOSIS — Z8249 Family history of ischemic heart disease and other diseases of the circulatory system: Secondary | ICD-10-CM | POA: Diagnosis not present

## 2018-03-23 NOTE — Progress Notes (Signed)
Cardiology Office Note  Date:  03/23/2018   ID:  Laura Dodson, DOB 1980-05-13, MRN [id number]  PCP:  Laura Manila, NP   Chief Complaint  Patient presents with  . other    Blurry vision after working out and irregular heart beat. Meds reviewed verbally with pt.    HPI:  [name] is a 38 year old woman with past medical history of Smoker, quit 2012 Referred by Laura Dodson referred for consultation of her blurry vision after working out  She reports that she has had 2 episodes of abnormal vision that presented approximately 30 minutes after she had worked out  Reports that she once ran a 5K spring 2019 and had recovered, and was driving home when she developed some visual disturbance Described as a kaleidoscope, lights flashing Eventually symptoms resolved without intervention  She had one other episode, again that developed long after she had participated in heavy physical activity  This is not a routine occurrence, typically able to exercise without vision problems  Denies any inappropriate tachycardia when she is exercising For example when she ran a 5K had heart rate up to 170 even 180 but felt this was appropriate based on her exercise level  Denies having inappropriate exercise tachycardia Monitors heart rate using her watch Never seems to jump up inappropriately Typically does not have a hard time recovering secondary to inappropriate tachycardia  She did some research on the vision issue and wonders if she might have atypical migraine  Long discussion concerning her family history Father CAD, MI, smoker  EKG personally reviewed by myself on todays visit Shows normal sinus rhythm with rate 61 bpm no significant ST or T wave changes    PMH:   has a past medical history of Anxiety.  PSH:   History reviewed. No pertinent surgical history.  Current Outpatient Medications  Medication Sig Dispense Refill  . ELDERBERRY PO Take by mouth.    Marland Kitchen  LORazepam (ATIVAN) 0.5 MG tablet Take 1 tablet (0.5 mg total) by mouth at bedtime as needed for anxiety. 15 tablet 5  . Multiple Vitamin (MULTIVITAMIN) tablet Take 1 tablet by mouth daily.    . valACYclovir (VALTREX) 500 MG tablet Take 500 mg by mouth 2 (two) times daily as needed.     No current facility-administered medications for this visit.      Allergies:   Patient has no known allergies.   Social History:  The patient  reports that she quit smoking about 8 years ago. She has a 7.50 pack-year smoking history. She has never used smokeless tobacco. She reports current alcohol use. She reports that she does not use drugs.   Family History:   family history includes Atrial fibrillation in her father; COPD in her father; Cancer in her paternal grandfather; Healthy in her brother, brother, daughter, and mother; Heart failure in her father; Lung cancer in her maternal grandmother; Melanoma in her maternal grandmother; Squamous cell carcinoma in her maternal aunt and mother.    Review of Systems: Review of Systems  Constitutional: Negative.   Eyes:       Visual disturbance  Respiratory: Negative.   Cardiovascular: Negative.   Gastrointestinal: Negative.   Musculoskeletal: Negative.   Neurological: Negative.   Psychiatric/Behavioral: Negative.   All other systems reviewed and are negative.   PHYSICAL EXAM: VS:  BP 108/78 (BP Location: Right Arm, Patient Position: Sitting, Cuff Size: Normal)   Pulse 61   Ht 5\' 6"  (1.676 m)   Wt 169 lb  12 oz (77 kg)   LMP 03/04/2018   BMI 27.40 kg/m  , BMI Body mass index is 27.4 kg/m. GEN: Well nourished, well developed, in no acute distress  HEENT: normal  Neck: no JVD, carotid bruits, or masses Cardiac: RRR; no murmurs, rubs, or gallops,no edema  Respiratory:  clear to auscultation bilaterally, normal work of breathing GI: soft, nontender, nondistended, + BS MS: no deformity or atrophy  Skin: warm and dry, no rash Neuro:  Strength and  sensation are intact Psych: euthymic mood, full affect   Recent Labs: 03/10/2018: ALT 17; BUN 14; Creat 0.79; Hemoglobin 13.1; Platelets 242; Potassium 4.4; Sodium 138; TSH 1.77    Lipid Panel Lab Results  Component Value Date   CHOL 177 03/10/2018   HDL 68 03/10/2018   LDLCALC 93 03/10/2018   TRIG 71 03/10/2018      Wt Readings from Last 3 Encounters:  03/23/18 169 lb 12 oz (77 kg)  03/10/18 170 lb 9.6 oz (77.4 kg)  11/25/17 155 lb (70.3 kg)      ASSESSMENT AND PLAN:  Former smoker Discussed prior smoking history, stopped 9 years ago Lungs clear on auscultation, discussed need to stay away from smoking  Visual disturbance Visual disturbance possibly scotoma or atypical migraine presented sometime after she had exercised even up to 30 minutes.  She had made a full recovery from her exercise.  Denies any exercise-induced tachycardia concerning for SVT. She is just bothered by the 2 episodes of visual disturbance described as a kaleidoscope turning in her eyes. Recommend she try to replete her electrolytes, stay hydrated after vigorous exercise.  If she continues to have recurrent episodes she may want to have evaluation by neurology.  She has seen eye doctor in the past, was told that she had " advanced aging in her eyes" --- Recommend if she has any symptoms with exertion such as inappropriate tachycardia, concern for low blood pressure while exercising, that she call our office for further evaluation, we would likely do a treadmill stress test -At present does not have any symptoms with exertion or even in immediate recovery. EKG normal, clinical exam normal  Family history of coronary artery disease Father longtime smoker, low ejection fraction, followed by cardiology She has quit smoking, few risk factors at this time She is non-smoker, no diabetes, cholesterol relatively well controlled on no medication -We did discuss CT coronary calcium scoring but likely not needed  at this time  Disposition:   F/U as needed   Total encounter time more than 60 minutes  Greater than 50% was spent in counseling and coordination of care with the patient  Patient was seen in consultation for Laura McardleLauren Dodson and will be referred back to her office for ongoing care of the issues detailed above  No orders of the defined types were placed in this encounter.    Signed, Dossie Arbourim Lavanya Roa, M.D., Ph.D. 03/23/2018  [facility] [place] [phone]

## 2018-03-23 NOTE — Patient Instructions (Signed)

## 2018-03-30 ENCOUNTER — Encounter: Payer: Self-pay | Admitting: Podiatry

## 2018-03-30 NOTE — Telephone Encounter (Signed)
Patient has follow up appt on 04/26/2018 with you.  Would you like for her to wait until her appt to restart her on Lamisil?  She stated that she only took the first dose of Lamisil and never got 90 day supply filled.  Please advise

## 2018-03-30 NOTE — Telephone Encounter (Deleted)
This patient has an appt 04/26/2018 with you.  Would you like her to wait until her appt before she starts back on Lamisil?  Please advise

## 2018-04-26 ENCOUNTER — Ambulatory Visit: Payer: BLUE CROSS/BLUE SHIELD | Admitting: Podiatry

## 2018-04-29 ENCOUNTER — Other Ambulatory Visit: Payer: Self-pay | Admitting: Nurse Practitioner

## 2018-04-29 DIAGNOSIS — F410 Panic disorder [episodic paroxysmal anxiety] without agoraphobia: Secondary | ICD-10-CM

## 2018-04-29 NOTE — Telephone Encounter (Signed)
Pt needs a refill on lorazepam sesnt to [facility].  Her call back [phone]

## 2018-04-29 NOTE — Telephone Encounter (Signed)
Please review. Thanks!  

## 2018-04-30 ENCOUNTER — Other Ambulatory Visit: Payer: Self-pay | Admitting: Nurse Practitioner

## 2018-04-30 MED ORDER — VALACYCLOVIR HCL 500 MG PO TABS: 500.0000 mg | ORAL_TABLET | Freq: Two times a day (BID) | ORAL | 1 refills | Status: DC | PRN

## 2018-04-30 NOTE — Telephone Encounter (Signed)
Pt needs a refill on valtrex

## 2018-04-30 NOTE — Telephone Encounter (Signed)
Please review. Thanks!  

## 2018-05-01 ENCOUNTER — Other Ambulatory Visit: Payer: Self-pay | Admitting: Nurse Practitioner

## 2018-05-01 DIAGNOSIS — F410 Panic disorder [episodic paroxysmal anxiety] without agoraphobia: Secondary | ICD-10-CM

## 2018-05-03 ENCOUNTER — Telehealth: Payer: Self-pay | Admitting: Nurse Practitioner

## 2018-05-03 ENCOUNTER — Encounter: Payer: Self-pay | Admitting: Nurse Practitioner

## 2018-05-03 NOTE — Telephone Encounter (Signed)
Pt needs a refill on lorazepam sent to [facility].  Her call back [phone]. Pt  Also wanted to know why  This was not called in

## 2018-05-03 NOTE — Telephone Encounter (Signed)
Addressed in MyChart encounter.

## 2018-05-04 ENCOUNTER — Encounter: Payer: Self-pay | Admitting: Nurse Practitioner

## 2018-05-04 ENCOUNTER — Ambulatory Visit (INDEPENDENT_AMBULATORY_CARE_PROVIDER_SITE_OTHER): Payer: BLUE CROSS/BLUE SHIELD | Admitting: Nurse Practitioner

## 2018-05-04 ENCOUNTER — Other Ambulatory Visit: Payer: Self-pay

## 2018-05-04 DIAGNOSIS — F410 Panic disorder [episodic paroxysmal anxiety] without agoraphobia: Secondary | ICD-10-CM

## 2018-05-04 MED ORDER — LORAZEPAM 0.5 MG PO TABS: 0.5000 mg | ORAL_TABLET | Freq: Every evening | ORAL | 5 refills | Status: DC | PRN

## 2018-05-04 NOTE — Patient Instructions (Addendum)
Laura Dodson,   Thank you for coming in to clinic today.  1. Refills are sent.  Continue only as needed for anxiety.  Please schedule a follow-up appointment with Wilhelmina Mcardle, AGNP. Return in about 6 months (around 11/04/2018) for anxiety.  If you have any other questions or concerns, please feel free to call the clinic or send a message through MyChart. You may also schedule an earlier appointment if necessary.  You will receive a survey after today's visit either digitally by e-mail or paper by [employer]. Your experiences and feedback matter to [place].  Please respond so we know how we are doing as we provide care for you.   Wilhelmina Mcardle, DNP, AGNP-BC Adult Gerontology Nurse Practitioner [facility], [facility]

## 2018-05-04 NOTE — Progress Notes (Signed)
Subjective:    Patient ID: Laura Dodson, female    DOB: Dec 04, 1980, 38 y.o.   MRN: [id number]  Laura Dodson is a 38 y.o. female presenting on 05/04/2018 for Anxiety   HPI Anxiety Kids are home and is having some increased situational anxiety. - Patient notes more difficulty with lack of control in last 1-2 weeks, especially last 3-4 days given Covid-19 pandemic escalation and now with kids staying home from school for social distancing. - Patient continues to have relief with 0.5 mg lorazepam once daily prn.  She has done well with 20 tabs per month, but with current circumstances may need daily.   GAD 7 : Generalized Anxiety Score 05/04/2018 09/07/2017  Nervous, Anxious, on Edge 0 0  Control/stop worrying 0 0  Worry too much - different things 0 0  Trouble relaxing 0 3  Restless 0 0  Easily annoyed or irritable 0 0  Afraid - awful might happen 0 0  Total GAD 7 Score 0 3  Anxiety Difficulty Not difficult at all Not difficult at all     Depression screen [facility] 2/9 05/04/2018 09/07/2017 05/25/2017  Decreased Interest 0 0 0  Down, Depressed, Hopeless 0 0 0  PHQ - 2 Score 0 0 0  Altered sleeping 0 0 -  Tired, decreased energy 0 0 -  Change in appetite 0 0 -  Feeling bad or failure about yourself  0 0 -  Trouble concentrating 0 3 -  Moving slowly or fidgety/restless 0 0 -  Suicidal thoughts 0 0 -  PHQ-9 Score 0 3 -  Difficult doing work/chores Not difficult at all Not difficult at all -     Social History   Tobacco Use  . Smoking status: Former Smoker    Packs/day: 0.50    Years: 15.00    Pack years: 7.50    Last attempt to quit: 2012    Years since quitting: 8.2  . Smokeless tobacco: Never Used  Substance Use Topics  . Alcohol use: Yes    Comment: 2-3  . Drug use: No    Review of Systems Per HPI unless specifically indicated above     Objective:    BP (!) 103/50 (BP Location: Left Arm, Patient Position: Sitting, Cuff Size: Normal)   Pulse 60   Temp 98.1 F  (36.7 C) (Oral)   Ht 5\' 6"  (1.676 m)   Wt 166 lb 6.4 oz (75.5 kg)   BMI 26.86 kg/m   Wt Readings from Last 3 Encounters:  05/04/18 166 lb 6.4 oz (75.5 kg)  03/23/18 169 lb 12 oz (77 kg)  03/10/18 170 lb 9.6 oz (77.4 kg)    Physical Exam Vitals signs reviewed.  Constitutional:      General: She is not in acute distress.    Appearance: She is well-developed.  HENT:     Head: Normocephalic and atraumatic.  Cardiovascular:     Rate and Rhythm: Normal rate and regular rhythm.     Pulses:          Radial pulses are 2+ on the right side and 2+ on the left side.       Posterior tibial pulses are 1+ on the right side and 1+ on the left side.     Heart sounds: Normal heart sounds, S1 normal and S2 normal.  Pulmonary:     Effort: Pulmonary effort is normal. No respiratory distress.     Breath sounds: Normal breath sounds and air entry.  Musculoskeletal:  Right lower leg: No edema.     Left lower leg: No edema.  Skin:    General: Skin is warm and dry.     Capillary Refill: Capillary refill takes less than 2 seconds.  Neurological:     Mental Status: She is alert and oriented to person, place, and time.  Psychiatric:        Attention and Perception: Attention normal.        Mood and Affect: Affect normal. Mood is anxious.        Speech: Speech is rapid and pressured.        Behavior: Behavior normal. Behavior is cooperative.       Assessment & Plan:   Problem List Items Addressed This Visit    None    Visit Diagnoses    Severe anxiety with panic       Relevant Medications   LORazepam (ATIVAN) 0.5 MG tablet    Currently under moderate control with some increased situational anxiety. GAD7 and PHQ 9 are negative today.   Patient previously well managed with prn lorazepam, less than daily dosing.    Plan: 1. Continue lorazepam - provide #30 tabs for 30 days given situational increase.  If needing daily, will need to consider starting daily maintenance med (SSRI or other).  2. Continue non-pharm stress management strategies 3. Follow-up 6 months.  Meds ordered this encounter  Medications  . LORazepam (ATIVAN) 0.5 MG tablet    Sig: Take 1 tablet (0.5 mg total) by mouth at bedtime as needed for anxiety.    Dispense:  30 tablet    Refill:  5    Order Specific Question:   Supervising Provider    Answer:   Smitty Cords [[id number]]    Follow up plan: Return in about 6 months (around 11/04/2018) for anxiety.  Wilhelmina Mcardle, DNP, AGPCNP-BC Adult Gerontology Primary Care Nurse Practitioner [facility] 05/04/2018, 3:57 PM

## 2018-05-07 ENCOUNTER — Encounter: Payer: Self-pay | Admitting: Nurse Practitioner

## 2018-05-14 ENCOUNTER — Ambulatory Visit: Payer: BLUE CROSS/BLUE SHIELD | Admitting: Internal Medicine

## 2018-06-28 ENCOUNTER — Telehealth: Payer: BLUE CROSS/BLUE SHIELD | Admitting: Physician Assistant

## 2018-06-28 ENCOUNTER — Encounter: Payer: Self-pay | Admitting: Physician Assistant

## 2018-06-28 DIAGNOSIS — R52 Pain, unspecified: Secondary | ICD-10-CM

## 2018-06-28 DIAGNOSIS — J029 Acute pharyngitis, unspecified: Secondary | ICD-10-CM

## 2018-06-28 DIAGNOSIS — R439 Unspecified disturbances of smell and taste: Secondary | ICD-10-CM

## 2018-06-28 DIAGNOSIS — R0602 Shortness of breath: Secondary | ICD-10-CM

## 2018-06-28 MED ORDER — ALBUTEROL SULFATE HFA 108 (90 BASE) MCG/ACT IN AERS: 2.0000 | INHALATION_SPRAY | RESPIRATORY_TRACT | 0 refills | Status: DC | PRN

## 2018-06-28 NOTE — Progress Notes (Signed)
E-Visit for Corona Virus Screening  Based on your current symptoms, you may very well have the virus, however your symptoms are mild. Currently, not all patients are being tested. If the symptoms are mild and there is not a known exposure, performing the test is not indicated.  You have been enrolled in MyChart Home Monitoring for COVID-19. Daily you will receive a questionnaire within the MyChart website. Our COVID-19 response team will be monitoring your responses daily.   Laura Dodson,  You have mentioned "chest pressure" as part of your symptoms. I called  and messaged to get more information, with no success.  If you symptom of chest pressure is true chest pain or chest tightness, please go to the ER  I have provided a work note.   Coronavirus disease 2019 (COVID-19) is a respiratory illness that can spread from person to person. The virus that causes COVID-19 is a new virus that was first identified in the country of [place] but is now found in multiple other countries and has spread to the [place].  Symptoms associated with the virus are mild to severe fever, cough, and shortness of breath. There is currently no vaccine to protect against COVID-19, and there is no specific antiviral treatment for the virus.   To be considered HIGH RISK for Coronavirus (COVID-19), you have to meet the following criteria:  . Traveled to [place], [place], [place], [place] or [place]; or in the [place] to [place], [place], [place], or [place]; and have fever, cough, and shortness of breath within the last 2 weeks of travel OR  . Been in close contact with a person diagnosed with COVID-19 within the last 2 weeks and have fever, cough, and shortness of breath  . IF YOU DO NOT MEET THESE CRITERIA, YOU ARE CONSIDERED LOW RISK FOR COVID-19.   It is vitally important that if you feel that you have an infection such as this virus or any other virus that you stay home and away from places where you may  spread it to others.  You should self-quarantine for 14 days if you have symptoms that could potentially be coronavirus and avoid contact with people age 40 and older.   You can use medication such as A prescription inhaler called Albuterol MDI 90 mcg /actuation 2 puffs every 4 hours as needed for shortness of breath, wheezing, cough  You may also take acetaminophen (Tylenol) as needed for fever.   Reduce your risk of any infection by using the same precautions used for avoiding the common cold or flu:  Marland Kitchen Wash your hands often with soap and warm water for at least 20 seconds.  If soap and water are not readily available, use an alcohol-based hand sanitizer with at least 60% alcohol.  . If coughing or sneezing, cover your mouth and nose by coughing or sneezing into the elbow areas of your shirt or coat, into a tissue or into your sleeve (not your hands). . Avoid shaking hands with others and consider head nods or verbal greetings only. . Avoid touching your eyes, nose, or mouth with unwashed hands.  . Avoid close contact with people who are sick. . Avoid places or events with large numbers of people in one location, like concerts or sporting events. . Carefully consider travel plans you have or are making. . If you are planning any travel outside or inside the [place], visit the CDC's Travelers' Health webpage for the latest health notices. . If you  have some symptoms but not all symptoms, continue to monitor at home and seek medical attention if your symptoms worsen. . If you are having a medical emergency, call 911.  HOME CARE . Only take medications as instructed by your medical team. . Drink plenty of fluids and get plenty of rest. . A steam or ultrasonic humidifier can help if you have congestion.   GET HELP RIGHT AWAY IF: . You develop worsening fever. . You become short of breath . You cough up blood. . Your symptoms become more severe MAKE SURE YOU   Understand these  instructions.  Will watch your condition.  Will get help right away if you are not doing well or get worse.  Your e-visit answers were reviewed by a board certified advanced clinical practitioner to complete your personal care plan.  Depending on the condition, your plan could have included both over the counter or prescription medications.  If there is a problem please reply once you have received a response from your provider. Your safety is important to us.  If you have drug allergies check your prescription carefully.    You can use MyChart to ask questions about today's visit, request a non-urgent call back, or ask for a work or school excuse for 24 hours related to this e-Visit. If it has been greater than 24 hours you will need to follow up with your provider, or enter a new e-Visit to address those concerns. You will get an e-mail in the next two days asking about your experience.  I hope that your e-visit has been valuable and will speed your recovery. Thank you for using e-visits.   I have spent 7 min in completion and review of this note- Illa LevelSahar [facility]

## 2018-07-05 DIAGNOSIS — Z20828 Contact with and (suspected) exposure to other viral communicable diseases: Secondary | ICD-10-CM | POA: Diagnosis not present

## 2018-09-22 DIAGNOSIS — R197 Diarrhea, unspecified: Secondary | ICD-10-CM | POA: Diagnosis not present

## 2018-09-22 DIAGNOSIS — Z20828 Contact with and (suspected) exposure to other viral communicable diseases: Secondary | ICD-10-CM | POA: Diagnosis not present

## 2018-10-19 DIAGNOSIS — Z3202 Encounter for pregnancy test, result negative: Secondary | ICD-10-CM | POA: Diagnosis not present

## 2018-10-19 DIAGNOSIS — N644 Mastodynia: Secondary | ICD-10-CM | POA: Diagnosis not present

## 2018-10-19 DIAGNOSIS — Z6827 Body mass index (BMI) 27.0-27.9, adult: Secondary | ICD-10-CM | POA: Diagnosis not present

## 2018-11-01 ENCOUNTER — Other Ambulatory Visit: Payer: Self-pay | Admitting: Nurse Practitioner

## 2018-11-01 DIAGNOSIS — F410 Panic disorder [episodic paroxysmal anxiety] without agoraphobia: Secondary | ICD-10-CM

## 2018-11-04 DIAGNOSIS — N644 Mastodynia: Secondary | ICD-10-CM | POA: Diagnosis not present

## 2018-11-04 DIAGNOSIS — R922 Inconclusive mammogram: Secondary | ICD-10-CM | POA: Diagnosis not present

## 2019-01-12 DIAGNOSIS — Z20828 Contact with and (suspected) exposure to other viral communicable diseases: Secondary | ICD-10-CM | POA: Diagnosis not present

## 2019-01-18 DIAGNOSIS — Z1239 Encounter for other screening for malignant neoplasm of breast: Secondary | ICD-10-CM | POA: Diagnosis not present

## 2019-01-18 DIAGNOSIS — Z1329 Encounter for screening for other suspected endocrine disorder: Secondary | ICD-10-CM | POA: Diagnosis not present

## 2019-01-18 DIAGNOSIS — Z1151 Encounter for screening for human papillomavirus (HPV): Secondary | ICD-10-CM | POA: Diagnosis not present

## 2019-01-18 DIAGNOSIS — Z124 Encounter for screening for malignant neoplasm of cervix: Secondary | ICD-10-CM | POA: Diagnosis not present

## 2019-01-18 DIAGNOSIS — Z01419 Encounter for gynecological examination (general) (routine) without abnormal findings: Secondary | ICD-10-CM | POA: Diagnosis not present

## 2019-02-08 DIAGNOSIS — Z20828 Contact with and (suspected) exposure to other viral communicable diseases: Secondary | ICD-10-CM | POA: Diagnosis not present

## 2019-02-08 DIAGNOSIS — R05 Cough: Secondary | ICD-10-CM | POA: Diagnosis not present

## 2019-02-24 DIAGNOSIS — M6200 Separation of muscle (nontraumatic), unspecified site: Secondary | ICD-10-CM | POA: Diagnosis not present

## 2019-02-24 DIAGNOSIS — N8184 Pelvic muscle wasting: Secondary | ICD-10-CM | POA: Diagnosis not present

## 2019-02-24 DIAGNOSIS — N3946 Mixed incontinence: Secondary | ICD-10-CM | POA: Diagnosis not present

## 2019-02-24 DIAGNOSIS — R3915 Urgency of urination: Secondary | ICD-10-CM | POA: Diagnosis not present

## 2019-02-28 DIAGNOSIS — B351 Tinea unguium: Secondary | ICD-10-CM | POA: Diagnosis not present

## 2019-03-03 DIAGNOSIS — N8184 Pelvic muscle wasting: Secondary | ICD-10-CM | POA: Diagnosis not present

## 2019-03-03 DIAGNOSIS — R3915 Urgency of urination: Secondary | ICD-10-CM | POA: Diagnosis not present

## 2019-03-03 DIAGNOSIS — M6200 Separation of muscle (nontraumatic), unspecified site: Secondary | ICD-10-CM | POA: Diagnosis not present

## 2019-03-03 DIAGNOSIS — N3946 Mixed incontinence: Secondary | ICD-10-CM | POA: Diagnosis not present

## 2019-03-16 DIAGNOSIS — R3915 Urgency of urination: Secondary | ICD-10-CM | POA: Diagnosis not present

## 2019-03-16 DIAGNOSIS — M6200 Separation of muscle (nontraumatic), unspecified site: Secondary | ICD-10-CM | POA: Diagnosis not present

## 2019-03-16 DIAGNOSIS — N8184 Pelvic muscle wasting: Secondary | ICD-10-CM | POA: Diagnosis not present

## 2019-03-16 DIAGNOSIS — N3946 Mixed incontinence: Secondary | ICD-10-CM | POA: Diagnosis not present

## 2019-03-23 DIAGNOSIS — R0981 Nasal congestion: Secondary | ICD-10-CM | POA: Diagnosis not present

## 2019-03-23 DIAGNOSIS — Z20822 Contact with and (suspected) exposure to covid-19: Secondary | ICD-10-CM | POA: Diagnosis not present

## 2019-03-23 DIAGNOSIS — Z6828 Body mass index (BMI) 28.0-28.9, adult: Secondary | ICD-10-CM | POA: Diagnosis not present

## 2019-03-28 DIAGNOSIS — Z13220 Encounter for screening for lipoid disorders: Secondary | ICD-10-CM | POA: Diagnosis not present

## 2019-03-28 DIAGNOSIS — G4489 Other headache syndrome: Secondary | ICD-10-CM | POA: Diagnosis not present

## 2019-03-28 DIAGNOSIS — F419 Anxiety disorder, unspecified: Secondary | ICD-10-CM | POA: Diagnosis not present

## 2019-03-28 DIAGNOSIS — R5383 Other fatigue: Secondary | ICD-10-CM | POA: Diagnosis not present

## 2019-03-28 DIAGNOSIS — E559 Vitamin D deficiency, unspecified: Secondary | ICD-10-CM | POA: Diagnosis not present

## 2019-04-13 DIAGNOSIS — R102 Pelvic and perineal pain: Secondary | ICD-10-CM | POA: Diagnosis not present

## 2019-04-13 DIAGNOSIS — Z6828 Body mass index (BMI) 28.0-28.9, adult: Secondary | ICD-10-CM | POA: Diagnosis not present

## 2019-04-13 DIAGNOSIS — N83209 Unspecified ovarian cyst, unspecified side: Secondary | ICD-10-CM | POA: Diagnosis not present

## 2019-04-13 DIAGNOSIS — N939 Abnormal uterine and vaginal bleeding, unspecified: Secondary | ICD-10-CM | POA: Diagnosis not present

## 2019-04-20 DIAGNOSIS — B370 Candidal stomatitis: Secondary | ICD-10-CM | POA: Diagnosis not present

## 2019-04-20 DIAGNOSIS — J0181 Other acute recurrent sinusitis: Secondary | ICD-10-CM | POA: Diagnosis not present

## 2019-04-21 DIAGNOSIS — N3946 Mixed incontinence: Secondary | ICD-10-CM | POA: Diagnosis not present

## 2019-04-21 DIAGNOSIS — M6200 Separation of muscle (nontraumatic), unspecified site: Secondary | ICD-10-CM | POA: Diagnosis not present

## 2019-04-21 DIAGNOSIS — N8184 Pelvic muscle wasting: Secondary | ICD-10-CM | POA: Diagnosis not present

## 2019-04-21 DIAGNOSIS — R3915 Urgency of urination: Secondary | ICD-10-CM | POA: Diagnosis not present

## 2019-05-19 DIAGNOSIS — N939 Abnormal uterine and vaginal bleeding, unspecified: Secondary | ICD-10-CM | POA: Diagnosis not present

## 2019-06-01 DIAGNOSIS — J0181 Other acute recurrent sinusitis: Secondary | ICD-10-CM | POA: Diagnosis not present

## 2019-06-06 DIAGNOSIS — J309 Allergic rhinitis, unspecified: Secondary | ICD-10-CM | POA: Diagnosis not present

## 2019-06-08 DIAGNOSIS — B351 Tinea unguium: Secondary | ICD-10-CM | POA: Diagnosis not present

## 2019-06-28 DIAGNOSIS — N3946 Mixed incontinence: Secondary | ICD-10-CM | POA: Diagnosis not present

## 2019-06-28 DIAGNOSIS — Z6828 Body mass index (BMI) 28.0-28.9, adult: Secondary | ICD-10-CM | POA: Diagnosis not present

## 2019-06-28 DIAGNOSIS — N8111 Cystocele, midline: Secondary | ICD-10-CM | POA: Diagnosis not present

## 2019-06-29 DIAGNOSIS — J0181 Other acute recurrent sinusitis: Secondary | ICD-10-CM | POA: Diagnosis not present

## 2019-07-01 DIAGNOSIS — N3946 Mixed incontinence: Secondary | ICD-10-CM | POA: Diagnosis not present

## 2019-07-01 DIAGNOSIS — N8111 Cystocele, midline: Secondary | ICD-10-CM | POA: Diagnosis not present

## 2019-07-01 DIAGNOSIS — Z4689 Encounter for fitting and adjustment of other specified devices: Secondary | ICD-10-CM | POA: Diagnosis not present

## 2019-07-01 DIAGNOSIS — Z6828 Body mass index (BMI) 28.0-28.9, adult: Secondary | ICD-10-CM | POA: Diagnosis not present

## 2019-09-13 DIAGNOSIS — J0181 Other acute recurrent sinusitis: Secondary | ICD-10-CM | POA: Diagnosis not present

## 2019-09-15 ENCOUNTER — Encounter: Payer: BLUE CROSS/BLUE SHIELD | Admitting: Family Medicine

## 2019-09-16 ENCOUNTER — Encounter: Payer: Self-pay | Admitting: Family Medicine

## 2019-09-16 ENCOUNTER — Other Ambulatory Visit: Payer: Self-pay

## 2019-09-16 ENCOUNTER — Ambulatory Visit (INDEPENDENT_AMBULATORY_CARE_PROVIDER_SITE_OTHER): Payer: BC Managed Care – PPO | Admitting: Family Medicine

## 2019-09-16 VITALS — BP 102/47 | HR 52 | Temp 98.1°F | Ht 66.0 in | Wt 172.6 lb

## 2019-09-16 DIAGNOSIS — Z1159 Encounter for screening for other viral diseases: Secondary | ICD-10-CM | POA: Diagnosis not present

## 2019-09-16 DIAGNOSIS — Z114 Encounter for screening for human immunodeficiency virus [HIV]: Secondary | ICD-10-CM | POA: Diagnosis not present

## 2019-09-16 DIAGNOSIS — Z0000 Encounter for general adult medical examination without abnormal findings: Secondary | ICD-10-CM | POA: Diagnosis not present

## 2019-09-16 DIAGNOSIS — Z79899 Other long term (current) drug therapy: Secondary | ICD-10-CM | POA: Diagnosis not present

## 2019-09-16 DIAGNOSIS — E349 Endocrine disorder, unspecified: Secondary | ICD-10-CM | POA: Insufficient documentation

## 2019-09-16 DIAGNOSIS — R635 Abnormal weight gain: Secondary | ICD-10-CM | POA: Diagnosis not present

## 2019-09-16 DIAGNOSIS — Z0184 Encounter for antibody response examination: Secondary | ICD-10-CM | POA: Insufficient documentation

## 2019-09-16 LAB — POCT URINALYSIS DIPSTICK
Bilirubin, UA: NEGATIVE
Blood, UA: NEGATIVE
Glucose, UA: NEGATIVE
Ketones, UA: NEGATIVE
Leukocytes, UA: NEGATIVE
Nitrite, UA: NEGATIVE
Protein, UA: NEGATIVE
Spec Grav, UA: <=1.005 — AB (ref 1.010–1.025)
Urobilinogen, UA: NEGATIVE E.U./dL — AB
pH, UA: 8 (ref 5.0–8.0)

## 2019-09-16 NOTE — Assessment & Plan Note (Signed)
Requesting COVID antibody testing.  Plan: 1. COVID antibody testing ordered, to be drawn today

## 2019-09-16 NOTE — Patient Instructions (Signed)
As we discussed, have your labs drawn in the next 1-2 weeks and we will contact you with the results.  Well Visit: Care Instructions Overview  Well visits can help you stay healthy. Your provider has checked your overall health and may have suggested ways to take good care of yourself. Your provider also may have recommended tests. At home, you can help prevent illness with healthy eating, regular exercise, and other steps.  Follow-up care is a key part of your treatment and safety. Be sure to make and go to all appointments, and call your provider if you are having problems. It's also a good idea to know your test results and keep a list of the medicines you take.  How can you care for yourself at home?   Get screening tests that you and your doctor decide on. Screening helps find diseases before any symptoms appear.   Eat healthy foods. Choose fruits, vegetables, whole grains, protein, and low-fat dairy foods. Limit fat, especially saturated fat. Reduce salt in your diet.   Limit alcohol. If you are a man, have no more than 2 drinks a day or 14 drinks a week. If you are a woman, have no more than 1 drink a day or 7 drinks a week.   Get at least 30 minutes of physical activity on most days of the week.  We recommend you go no more than 2 days in a row without exercise. Walking is a good choice. You also may want to do other activities, such as running, swimming, cycling, or playing tennis or team sports. Discuss any changes in your exercise program with your provider.   Reach and stay at a healthy weight. This will lower your risk for many problems, such as obesity, diabetes, heart disease, and high blood pressure.   Do not smoke or allow others to smoke around you. If you need help quitting, talk to your provider about stop-smoking programs and medicines. These can increase your chances of quitting for good.  Can call 1-800-QUIT-NOW ((548)608-2431) for the [facility],  assistance with smoking cessation.   Care for your mental health. It is easy to get weighed down by worry and stress. Learn strategies to manage stress, like deep breathing and mindfulness, and stay connected with your family and community. If you find you often feel sad or hopeless, talk with your provider. Treatment can help.   Talk to your provider about whether you have any risk factors for sexually transmitted infections (STIs). You can help prevent STIs if you wait to have sex with a new partner (or partners) until you've each been tested for STIs. It also helps if you use condoms (female or female condoms) and if you limit your sex partners to one person who only has sex with you. Vaccines are available for some STIs, such as HPV (these are age dependent).   Use birth control if it's important to you to prevent pregnancy. Talk with your provider about the choices available and what might be best for you.   If you think you may have a problem with alcohol or drug use, talk to your provider. This includes prescription medicines (such as amphetamines and opioids) and illegal drugs (such as cocaine and methamphetamine). Your provider can help you figure out what type of treatment is best for you.   If you have concerns about domestic violence or intimate partner violence, there are resources available to you. National Domestic Abuse Hotline 5012782412   Protect your  skin from too much sun. When you're outdoors from 10 a.m. to 4 p.m., stay in the shade or cover up with clothing and a hat with a wide brim. Wear sunglasses that block UV rays. Even when it's cloudy, put broad-spectrum sunscreen (SPF 30 or higher) on any exposed skin.   See a dentist one or two times a year for checkups and to have your teeth cleaned.   See an eye doctor once per year for an eye exam.   Wear a seat belt in the car.  When should you call for help?  Watch closely for changes in your health, and be sure to  contact your provider if you have any problems or symptoms that concern you.  We will plan to see you back in 1 year for your next physical  You will receive a survey after today's visit either digitally by e-mail or paper by [employer] mail. Your experiences and feedback matter to [place].  Please respond so we know how we are doing as we provide care for you.  Call us with any questions/concerns/needs.  It is my goal to be available to you for your health concerns.  Thanks for choosing me to be a partner in your healthcare needs!  Charlaine Dalton, FNP-C Family Nurse Practitioner [facility] Phone: [phone]

## 2019-09-16 NOTE — Progress Notes (Signed)
Subjective:    Patient ID: Laura Dodson, female    DOB: 04-18-1980, 39 y.o.   MRN: [id number]  Laura Dodson is a 39 y.o. female presenting on 09/16/2019 for Annual Exam   HPI   HEALTH MAINTENANCE:  Weight/BMI: Overweight, BMI 27.86 Physical activity: Stays active Diet: Regular Seatbelt: All the time Sunscreen: All the time Mammogram: Reports completes with GYN PAP: Scheduled with OB/GYN for 12/2019 HIV & Hep C Screening: HIV screening completed 01/10/2014 and negative.  Hep C screening to be completed today GC/CT: Offered and declined Optometry: Yearly Dentistry: Yearly  IMMUNIZATIONS: Influenza: Due next season Tetanus: Up to date, 06/04/2016 COVID: Reports unsure, antibody lab ordered  [facility] 09/16/2019 05/04/2018 09/07/2017  Decreased Interest 0 0 0  Down, Depressed, Hopeless 0 0 0  PHQ - 2 Score 0 0 0  Altered sleeping 1 0 0  Tired, decreased energy 1 0 0  Change in appetite 0 0 0  Feeling bad or failure about yourself  0 0 0  Trouble concentrating 0 0 3  Moving slowly or fidgety/restless 0 0 0  Suicidal thoughts 0 0 0  PHQ-9 Score 2 0 3  Difficult doing work/chores Not difficult at all Not difficult at all Not difficult at all    Past Medical History:  Diagnosis Date  . Anxiety    History reviewed. No pertinent surgical history. Social History   Socioeconomic History  . Marital status: Married    Spouse name: Not on file  . Number of children: Not on file  . Years of education: Not on file  . Highest education level: Associate degree: occupational, Scientist, product/process developmenttechnical, or vocational program  Occupational History  . Not on file  Tobacco Use  . Smoking status: Former Smoker    Packs/day: 0.50    Years: 15.00    Pack years: 7.50    Quit date: 2012    Years since quitting: 9.5  . Smokeless tobacco: Never Used  Vaping Use  . Vaping Use: Never used  Substance and Sexual Activity  . Alcohol use: Yes    Comment: 2-3  . Drug use: No  .  Sexual activity: Not on file  Other Topics Concern  . Not on file  Social History Narrative  . Not on file   Social Determinants of Health   Financial Resource Strain:   . Difficulty of Paying Living Expenses:   Food Insecurity:   . Worried About Programme researcher, broadcasting/film/videounning Out of Food in the Last Year:   . Baristaan Out of Food in the Last Year:   Transportation Needs:   . Freight forwarderLack of Transportation (Medical):   Marland Kitchen. Lack of Transportation (Non-Medical):   Physical Activity:   . Days of Exercise per Week:   . Minutes of Exercise per Session:   Stress:   . Feeling of Stress :   Social Connections:   . Frequency of Communication with Friends and Family:   . Frequency of Social Gatherings with Friends and Family:   . Attends Religious Services:   . Active Member of Clubs or Organizations:   . Attends BankerClub or Organization Meetings:   Marland Kitchen. Marital Status:   Intimate Partner Violence:   . Fear of Current or Ex-Partner:   . Emotionally Abused:   Marland Kitchen. Physically Abused:   . Sexually Abused:    Family History  Problem Relation Age of Onset  . Healthy Mother   . Squamous cell carcinoma Mother   . Atrial fibrillation Father   .  COPD Father   . Heart failure Father   . Healthy Brother   . Healthy Daughter   . Squamous cell carcinoma Maternal Aunt   . Melanoma Maternal Grandmother   . Lung cancer Maternal Grandmother   . Cancer Paternal Grandfather   . Healthy Brother    Current Outpatient Medications on File Prior to Visit  Medication Sig  . ELDERBERRY PO Take by mouth.  . fluconazole (DIFLUCAN) 100 MG tablet Take by mouth.  Marland Kitchen ketoconazole (NIZORAL) 2 % cream SMARTSIG:Strip(s) Both Nares Every Night  . LORazepam (ATIVAN) 0.5 MG tablet Take 1 tablet (0.5 mg total) by mouth at bedtime as needed for anxiety.  . valACYclovir (VALTREX) 500 MG tablet Take 1 tablet (500 mg total) by mouth 2 (two) times daily as needed.  . Multiple Vitamin (MULTIVITAMIN) tablet Take 1 tablet by mouth daily. (Patient not taking:  Reported on 09/16/2019)   No current facility-administered medications on file prior to visit.    Per HPI unless specifically indicated above     Objective:    BP (!) 102/47 (BP Location: Left Arm, Patient Position: Sitting, Cuff Size: Normal)   Pulse 52   Temp 98.1 F (36.7 C) (Oral)   Ht 5\' 6"  (1.676 m)   Wt 172 lb 9.6 oz (78.3 kg)   LMP 09/02/2019   SpO2 100%   BMI 27.86 kg/m   Wt Readings from Last 3 Encounters:  09/16/19 172 lb 9.6 oz (78.3 kg)  05/04/18 166 lb 6.4 oz (75.5 kg)  03/23/18 169 lb 12 oz (77 kg)   Physical Exam Vitals reviewed.  Constitutional:      General: She is not in acute distress.    Appearance: Normal appearance. She is well-developed, well-groomed and overweight. She is not ill-appearing or toxic-appearing.  HENT:     Head: Normocephalic and atraumatic.     Right Ear: Tympanic membrane, ear canal and external ear normal. There is no impacted cerumen.     Left Ear: Tympanic membrane, ear canal and external ear normal. There is no impacted cerumen.     Nose: Nose normal. No congestion or rhinorrhea.     Mouth/Throat:     Lips: Pink.     Mouth: Mucous membranes are moist.     Pharynx: Oropharynx is clear. Uvula midline. No oropharyngeal exudate or posterior oropharyngeal erythema.  Eyes:     General: Lids are normal. Vision grossly intact. No scleral icterus.       Right eye: No discharge.        Left eye: No discharge.     Extraocular Movements: Extraocular movements intact.     Conjunctiva/sclera: Conjunctivae normal.     Pupils: Pupils are equal, round, and reactive to light.  Neck:     Thyroid: No thyroid mass or thyromegaly.  Cardiovascular:     Rate and Rhythm: Normal rate and regular rhythm.     Pulses: Normal pulses.          Dorsalis pedis pulses are 2+ on the right side and 2+ on the left side.     Heart sounds: Normal heart sounds. No murmur heard.  No friction rub. No gallop.   Pulmonary:     Effort: Pulmonary effort is normal.  No respiratory distress.     Breath sounds: Normal breath sounds.  Chest:     Comments: Deferred Abdominal:     General: Abdomen is flat. Bowel sounds are normal. There is no distension.     Palpations: Abdomen is  soft. There is no hepatomegaly, splenomegaly or mass.     Tenderness: There is no abdominal tenderness. There is no guarding or rebound.     Hernia: No hernia is present.  Genitourinary:    Comments: Deferred Musculoskeletal:        General: Normal range of motion.     Cervical back: Normal range of motion and neck supple. No tenderness.     Right lower leg: No edema.     Left lower leg: No edema.     Comments: Normal tone, strength 5/5 BUE & BLE  Feet:     Right foot:     Skin integrity: Skin integrity normal.     Left foot:     Skin integrity: Skin integrity normal.  Lymphadenopathy:     Cervical: No cervical adenopathy.  Skin:    General: Skin is warm and dry.     Capillary Refill: Capillary refill takes less than 2 seconds.  Neurological:     General: No focal deficit present.     Mental Status: She is alert and oriented to person, place, and time.     Cranial Nerves: No cranial nerve deficit.     Sensory: No sensory deficit.     Motor: No weakness.     Coordination: Coordination normal.     Gait: Gait normal.     Deep Tendon Reflexes: Reflexes normal.  Psychiatric:        Attention and Perception: Attention and perception normal.        Mood and Affect: Mood and affect normal.        Speech: Speech normal.        Behavior: Behavior normal. Behavior is cooperative.        Thought Content: Thought content normal.        Cognition and Memory: Cognition and memory normal.        Judgment: Judgment normal.     Results for orders placed or performed in visit on 09/16/19  [facility] HIV SCREENING LAB  Result Value Ref Range   [facility] HIV Screening Negative - Validated   POCT Urinalysis Dipstick  Result Value Ref Range   Color, UA yellow    Clarity, UA clear     Glucose, UA Negative Negative   Bilirubin, UA negative    Ketones, UA negative    Spec Grav, UA <=1.005 (A) 1.010 - 1.025   Blood, UA negative    pH, UA 8.0 5.0 - 8.0   Protein, UA Negative Negative   Urobilinogen, UA negative (A) 0.2 or 1.0 E.U./dL   Nitrite, UA negative    Leukocytes, UA Negative Negative   Appearance     Odor        Assessment & Plan:   Problem List Items Addressed This Visit      Endocrine   Hormone disturbance    Requesting FSH/LH testing, is mid cycle, has been taking OTC supplements with [employer] for healthy uterine drops and e balance.  Plan: 1. Labs ordered for evaluation      Relevant Orders   FSH/LH     Other   Annual physical exam - Primary    Annual physical exam without new findings.  Well adult with no acute concerns.  Plan: 1. Obtain health maintenance screenings as above according to age. - Increase physical activity to 30 minutes most days of the week.  - Eat healthy diet high in vegetables and fruits; low in refined carbohydrates. - Screening labs and  tests as ordered -Following with OB/GYN, PAP testing scheduled for November 2021 2. Return 1 year for annual physical.       Relevant Orders   POCT Urinalysis Dipstick (Completed)   CBC with Differential   COMPLETE METABOLIC PANEL WITH GFR   Immunity status testing    Requesting COVID antibody testing.  Plan: 1. COVID antibody testing ordered, to be drawn today      Relevant Orders   SARS-CoV-2 Semi-Quantitative Total Antibody, Spike    Other Visit Diagnoses    Weight gain       Relevant Orders   Lipid Profile   Thyroid Panel With TSH   FSH/LH   Screening for HIV (human immunodeficiency virus)       Encounter for hepatitis C screening test for low risk patient       Relevant Orders   Hepatitis C Antibody   Long-term use of high-risk medication       Relevant Orders   Lipid Profile   Thyroid Panel With TSH      No orders of the defined types were placed  in this encounter.     Follow up plan: Return in about 1 year (around 09/15/2020) for CPE.  Charlaine Dalton, FNP-C Family Nurse Practitioner [facility] 09/16/2019, 1:05 PM

## 2019-09-16 NOTE — Assessment & Plan Note (Signed)
Requesting FSH/LH testing, is mid cycle, has been taking OTC supplements with [employer] for healthy uterine drops and e balance.  Plan: 1. Labs ordered for evaluation

## 2019-09-16 NOTE — Assessment & Plan Note (Signed)
Annual physical exam without new findings.  Well adult with no acute concerns.  Plan: 1. Obtain health maintenance screenings as above according to age. - Increase physical activity to 30 minutes most days of the week.  - Eat healthy diet high in vegetables and fruits; low in refined carbohydrates. - Screening labs and tests as ordered -Following with OB/GYN, PAP testing scheduled for November 2021 2. Return 1 year for annual physical.

## 2019-09-20 LAB — FSH/LH
FSH: 8.5 m[IU]/mL
LH: 9.9 m[IU]/mL

## 2019-09-20 LAB — CBC WITH DIFFERENTIAL/PLATELET
Absolute Monocytes: 304 cells/uL (ref 200–950)
Basophils Absolute: 18 cells/uL (ref 0–200)
Basophils Relative: 0.4 %
Eosinophils Absolute: 60 cells/uL (ref 15–500)
Eosinophils Relative: 1.3 %
HCT: 41.8 % (ref 35.0–45.0)
Hemoglobin: 13.6 g/dL (ref 11.7–15.5)
Lymphs Abs: 1780 cells/uL (ref 850–3900)
MCH: 32.5 pg (ref 27.0–33.0)
MCHC: 32.5 g/dL (ref 32.0–36.0)
MCV: 99.8 fL (ref 80.0–100.0)
MPV: 10.4 fL (ref 7.5–12.5)
Monocytes Relative: 6.6 %
Neutro Abs: 2438 cells/uL (ref 1500–7800)
Neutrophils Relative %: 53 %
Platelets: 185 10*3/uL (ref 140–400)
RBC: 4.19 10*6/uL (ref 3.80–5.10)
RDW: 12 % (ref 11.0–15.0)
Total Lymphocyte: 38.7 %
WBC: 4.6 10*3/uL (ref 3.8–10.8)

## 2019-09-20 LAB — LIPID PANEL
Cholesterol: 179 mg/dL (ref <200)
HDL: 69 mg/dL (ref >=50)
LDL Cholesterol (Calc): 93 mg/dL (calc)
Non-HDL Cholesterol (Calc): 110 mg/dL (calc) (ref <130)
Total CHOL/HDL Ratio: 2.6 (calc) (ref <5.0)
Triglycerides: 78 mg/dL (ref <150)

## 2019-09-20 LAB — COMPLETE METABOLIC PANEL WITH GFR
AG Ratio: 1.7 (calc) (ref 1.0–2.5)
ALT: 10 U/L (ref 6–29)
AST: 22 U/L (ref 10–30)
Albumin: 4.3 g/dL (ref 3.6–5.1)
Alkaline phosphatase (APISO): 32 U/L (ref 31–125)
BUN: 11 mg/dL (ref 7–25)
CO2: 28 mmol/L (ref 20–32)
Calcium: 8.8 mg/dL (ref 8.6–10.2)
Chloride: 104 mmol/L (ref 98–110)
Creat: 0.69 mg/dL (ref 0.50–1.10)
GFR, Est African American: 126 mL/min/{1.73_m2} (ref >=60)
GFR, Est Non African American: 109 mL/min/{1.73_m2} (ref >=60)
Globulin: 2.6 g/dL (calc) (ref 1.9–3.7)
Glucose, Bld: 86 mg/dL (ref 65–99)
Potassium: 4.4 mmol/L (ref 3.5–5.3)
Sodium: 138 mmol/L (ref 135–146)
Total Bilirubin: 0.7 mg/dL (ref 0.2–1.2)
Total Protein: 6.9 g/dL (ref 6.1–8.1)

## 2019-09-20 LAB — THYROID PANEL WITH TSH
Free Thyroxine Index: 2.3 (ref 1.4–3.8)
T3 Uptake: 33 % (ref 22–35)
T4, Total: 7.1 ug/dL (ref 5.1–11.9)
TSH: 1.16 mIU/L

## 2019-09-20 LAB — SARS-COV-2 SEMI-QUANTITATIVE TOTAL ANTIBODY, SPIKE: SARS COV2 AB, Total Spike Semi QN: <0.4 U/mL (ref <0.8)

## 2019-09-20 LAB — HEPATITIS C ANTIBODY
Hepatitis C Ab: NONREACTIVE
SIGNAL TO CUT-OFF: 0.01 (ref <1.00)

## 2019-10-04 DIAGNOSIS — J0181 Other acute recurrent sinusitis: Secondary | ICD-10-CM | POA: Diagnosis not present

## 2019-10-17 ENCOUNTER — Encounter: Payer: Self-pay | Admitting: Family Medicine

## 2019-10-17 ENCOUNTER — Telehealth (INDEPENDENT_AMBULATORY_CARE_PROVIDER_SITE_OTHER): Payer: BC Managed Care – PPO | Admitting: Family Medicine

## 2019-10-17 ENCOUNTER — Other Ambulatory Visit: Payer: Self-pay

## 2019-10-17 DIAGNOSIS — F410 Panic disorder [episodic paroxysmal anxiety] without agoraphobia: Secondary | ICD-10-CM | POA: Diagnosis not present

## 2019-10-17 DIAGNOSIS — U071 COVID-19: Secondary | ICD-10-CM

## 2019-10-17 MED ORDER — LORAZEPAM 0.5 MG PO TABS: 0.5000 mg | ORAL_TABLET | Freq: Every evening | ORAL | 0 refills | Status: DC | PRN

## 2019-10-17 NOTE — Assessment & Plan Note (Addendum)
COVID-19 +, sx onset 8/22, tested 8/23, results received 8/24 as positive.  Has been taking sudafed, green tea, chamomile tea, increased her water intake, taking zinc, vitamin C and elderberry.  Her ENT provided sent in an rx for azithromycin.  Has been improving with symptoms but has started coughing in the past 2 days with a brownish sputum.  Has had some upper back tightness but no DOE, SOB, dizziness or fevers, likely MSK due to coughing.  Will continue symptom management and to switch from sudafed to mucinex to help break up the mucus.  Has had some increased anxiety with this diagnosis, will send in refill on her lorazepam.  Plan: 1. Switch from sudafed to mucinex. 2. Continued increased water intake and OTC supplements 3. RTC as needed and contact us if symptoms worsen

## 2019-10-17 NOTE — Patient Instructions (Addendum)
As we discussed, continue to hydrate, switch to mucinex from sudafed to help with breaking up of mucus.  You may find that you are having increased cough or slight worsening of symptoms with the mucinex.  It helps to bring the thickness of the mucus down so it is easier to get out of your system.  If you have any worsening of symptoms before Wednesday, to let me know and I can put in the consult to the infusion clinic.  Continue quarantine through 10/19/2019  We will plan to see you back if your symptoms worsen or fail to improve  You will receive a survey after today's visit either digitally by e-mail or paper by USPS mail. Your experiences and feedback matter to [place].  Please respond so we know how we are doing as we provide care for you.  Call us with any questions/concerns/needs.  It is my goal to be available to you for your health concerns.  Thanks for choosing me to be a partner in your healthcare needs!  Charlaine Dalton, FNP-C Family Nurse Practitioner [facility] Phone: [phone]

## 2019-10-17 NOTE — Progress Notes (Signed)
Virtual Visit via Telephone  The purpose of this virtual visit is to provide medical care while limiting exposure to the novel coronavirus (COVID19) for both patient and office staff.  Consent was obtained for phone visit:  Yes.   Answered questions that patient had about telehealth interaction:  Yes.   I discussed the limitations, risks, security and privacy concerns of performing an evaluation and management service by telephone. I also discussed with the patient that there may be a patient responsible charge related to this service. The patient expressed understanding and agreed to proceed.  Patient is at home and is accessed via telephone Services are provided by Harlin Rain, FNP-C from [facility])  ---------------------------------------------------------------------- Chief Complaint  Patient presents with  . Covid Exposure    pt diagnose with COVID, put on Zpack by her ENT doctor. Pt had a productive cough w/ brownish sputum  x  diagnose on Tuesday, August 24th. Nasal drainage, productive cough, tachcardia unsure if it is related to the sudafed.  symptoms are improving.     S: Reviewed CMA documentation. I have called patient and gathered additional HPI as follows:  Ms. Butkiewicz presents for concerns of positive COVID testing on 10/11/2019 with symptoms starting on 10/09/2019.  Has met with her ENT provider and was started on azithromycin.  Has had an increase in cough over the past 2 days since taking sudafed with some brown sputum and some tightening in her back.  Denies fevers, sore throat, change in taste/smell, SOB, DOE, CP, abdominal pain, n/v/d.  Patient is currently home in isolation Denies any high risk travel to areas of current concern for COVID19. Denies any known or suspected exposure to person with or possibly with COVID19.  Past Medical History:  Diagnosis Date  . Anxiety    Social History   Tobacco Use  . Smoking status: Former  Smoker    Packs/day: 0.50    Years: 15.00    Pack years: 7.50    Quit date: 2012    Years since quitting: 9.6  . Smokeless tobacco: Never Used  Vaping Use  . Vaping Use: Never used  Substance Use Topics  . Alcohol use: Yes    Comment: 2-3  . Drug use: No    Current Outpatient Medications:  .  Ascorbic Acid (VITAMIN C) 100 MG tablet, Take 100 mg by mouth daily., Disp: , Rfl:  .  azithromycin (ZITHROMAX) 250 MG tablet, Take 250 mg by mouth as directed., Disp: , Rfl:  .  ELDERBERRY PO, Take by mouth., Disp: , Rfl:  .  ketoconazole (NIZORAL) 2 % cream, SMARTSIG:Strip(s) Both Nares Every Night, Disp: , Rfl:  .  LORazepam (ATIVAN) 0.5 MG tablet, Take 1 tablet (0.5 mg total) by mouth at bedtime as needed for anxiety., Disp: 30 tablet, Rfl: 0 .  valACYclovir (VALTREX) 500 MG tablet, Take 1 tablet (500 mg total) by mouth 2 (two) times daily as needed., Disp: 30 tablet, Rfl: 1 .  Zinc 100 MG TABS, Take by mouth., Disp: , Rfl:  .  hydroxychloroquine (PLAQUENIL) 200 MG tablet, Take 200 mg by mouth 2 (two) times daily. (Patient not taking: Reported on 10/17/2019), Disp: , Rfl:  .  Multiple Vitamin (MULTIVITAMIN) tablet, Take 1 tablet by mouth daily. (Patient not taking: Reported on 10/17/2019), Disp: , Rfl:   Depression screen [facility] 2/9 09/16/2019 05/04/2018 09/07/2017  Decreased Interest 0 0 0  Down, Depressed, Hopeless 0 0 0  PHQ - 2 Score 0 0 0  Altered sleeping 1 0 0  Tired, decreased energy 1 0 0  Change in appetite 0 0 0  Feeling bad or failure about yourself  0 0 0  Trouble concentrating 0 0 3  Moving slowly or fidgety/restless 0 0 0  Suicidal thoughts 0 0 0  PHQ-9 Score 2 0 3  Difficult doing work/chores Not difficult at all Not difficult at all Not difficult at all    GAD 7 : Generalized Anxiety Score 09/16/2019 05/04/2018 09/07/2017  Nervous, Anxious, on Edge 0 0 0  Control/stop worrying 0 0 0  Worry too much - different things 0 0 0  Trouble relaxing 0 0 3  Restless 0 0 0  Easily  annoyed or irritable 0 0 0  Afraid - awful might happen 0 0 0  Total GAD 7 Score 0 0 3  Anxiety Difficulty Not difficult at all Not difficult at all Not difficult at all    -------------------------------------------------------------------------- O: No physical exam performed due to remote telephone encounter.  Physical Exam: Patient remotely monitored without video.  Verbal communication appropriate.  Cognition normal.  Recent Results (from the past 2160 hour(s))  POCT Urinalysis Dipstick     Status: Abnormal   Collection Time: 09/16/19 10:01 AM  Result Value Ref Range   Color, UA yellow    Clarity, UA clear    Glucose, UA Negative Negative   Bilirubin, UA negative    Ketones, UA negative    Spec Grav, UA <=1.005 (A) 1.010 - 1.025   Blood, UA negative    pH, UA 8.0 5.0 - 8.0   Protein, UA Negative Negative   Urobilinogen, UA negative (A) 0.2 or 1.0 E.U./dL   Nitrite, UA negative    Leukocytes, UA Negative Negative   Appearance     Odor    CBC with Differential     Status: None   Collection Time: 09/16/19 10:28 AM  Result Value Ref Range   WBC 4.6 3.8 - 10.8 Thousand/uL   RBC 4.19 3.80 - 5.10 Million/uL   Hemoglobin 13.6 11.7 - 15.5 g/dL   HCT 41.8 35 - 45 %   MCV 99.8 80.0 - 100.0 fL   MCH 32.5 27.0 - 33.0 pg   MCHC 32.5 32.0 - 36.0 g/dL   RDW 12.0 11.0 - 15.0 %   Platelets 185 140 - 400 Thousand/uL   MPV 10.4 7.5 - 12.5 fL   Neutro Abs 2,438 1,500 - 7,800 cells/uL   Lymphs Abs 1,780 850 - 3,900 cells/uL   Absolute Monocytes 304 200 - 950 cells/uL   Eosinophils Absolute 60 15 - 500 cells/uL   Basophils Absolute 18 0 - 200 cells/uL   Neutrophils Relative % 53 %   Total Lymphocyte 38.7 %   Monocytes Relative 6.6 %   Eosinophils Relative 1.3 %   Basophils Relative 0.4 %  COMPLETE METABOLIC PANEL WITH GFR     Status: None   Collection Time: 09/16/19 10:28 AM  Result Value Ref Range   Glucose, Bld 86 65 - 99 mg/dL    Comment: .            Fasting reference  interval .    BUN 11 7 - 25 mg/dL   Creat 0.69 0.50 - 1.10 mg/dL   GFR, Est Non African American 109 > OR = 60 mL/min/1.51m   GFR, Est African American 126 > OR = 60 mL/min/1.780m  BUN/Creatinine Ratio NOT APPLICABLE 6 - 22 (calc)   Sodium 138 135 -  146 mmol/L   Potassium 4.4 3.5 - 5.3 mmol/L   Chloride 104 98 - 110 mmol/L   CO2 28 20 - 32 mmol/L   Calcium 8.8 8.6 - 10.2 mg/dL   Total Protein 6.9 6.1 - 8.1 g/dL   Albumin 4.3 3.6 - 5.1 g/dL   Globulin 2.6 1.9 - 3.7 g/dL (calc)   AG Ratio 1.7 1.0 - 2.5 (calc)   Total Bilirubin 0.7 0.2 - 1.2 mg/dL   Alkaline phosphatase (APISO) 32 31 - 125 U/L   AST 22 10 - 30 U/L   ALT 10 6 - 29 U/L  Lipid Profile     Status: None   Collection Time: 09/16/19 10:28 AM  Result Value Ref Range   Cholesterol 179 <200 mg/dL   HDL 69 > OR = 50 mg/dL   Triglycerides 78 <150 mg/dL   LDL Cholesterol (Calc) 93 mg/dL (calc)    Comment: Reference range: <100 . Desirable range <100 mg/dL for primary prevention;   <70 mg/dL for patients with CHD or diabetic patients  with > or = 2 CHD risk factors. Marland Kitchen LDL-C is now calculated using the Martin-Hopkins  calculation, which is a validated novel method providing  better accuracy than the Friedewald equation in the  estimation of LDL-C.  Cresenciano Genre et al. Annamaria Helling. 9798;921(19): 2061-2068  (http://education.[facility].com/faq/FAQ164)    Total CHOL/HDL Ratio 2.6 <5.0 (calc)   Non-HDL Cholesterol (Calc) 110 <130 mg/dL (calc)    Comment: For patients with diabetes plus 1 major ASCVD risk  factor, treating to a non-HDL-C goal of <100 mg/dL  (LDL-C of <70 mg/dL) is considered a therapeutic  option.   Thyroid Panel With TSH     Status: None   Collection Time: 09/16/19 10:28 AM  Result Value Ref Range   T3 Uptake 33 22 - 35 %   T4, Total 7.1 5.1 - 11.9 mcg/dL   Free Thyroxine Index 2.3 1.4 - 3.8   TSH 1.16 mIU/L    Comment:           Reference Range .           > or = 20 Years  0.40-4.50 .                 Pregnancy Ranges           First trimester    0.26-2.66           Second trimester   0.55-2.73           Third trimester    0.43-2.91   Hepatitis C Antibody     Status: None   Collection Time: 09/16/19 10:28 AM  Result Value Ref Range   Hepatitis C Ab NON-REACTIVE NON-REACTI   SIGNAL TO CUT-OFF 0.01 <1.00    Comment: . HCV antibody was non-reactive. There is no laboratory  evidence of HCV infection. . In most cases, no further action is required. However, if recent HCV exposure is suspected, a test for HCV RNA (test code [phone]) is suggested. . For additional information please refer to http://education.[facility].com/faq/FAQ22v1 (This link is being provided for informational/ educational purposes only.) .   SARS-CoV-2 Semi-Quantitative Total Antibody, Spike     Status: None   Collection Time: 09/16/19 10:28 AM  Result Value Ref Range   SARS COV2 AB, Total Spike Semi QN <0.4 <0.8 U/mL    Comment: . INDEX          INTERPRETATION -----          --------------  <  0.8          Negative > or = 0.8     Positive . This test is intended to help identify individuals with antibodies to SARS-CoV-2 (COVID-19). The results of this semi-quantitative test should not be interpreted as an indication or degree of immunity or protection from reinfection. . A test result that is 0.8 or more (Positive) means antibodies to SARS-CoV-2 were detected in the blood sample by the test. This could mean that the individual may have an immune response to a recent or prior infection with SARS-CoV-2. Positive results may occur after COVID-19 vaccination, but the clinical significance of a positive antibody result for individuals that have received a COVID-19 vaccine is unknown, and the performance of the test has not been established in COVID-19 vaccinees.  False positive results for the test may occur due to cross-reactivity from pre-existing antibodies or other possible causes. . . A   test result that is less than 0.8 (Negative) means that antibodies were not detected in the blood sample by the test. This could mean that the individual has not been previously infected with SARS-CoV-2. The clinical significance of a negative antibody result for individuals that have received a COVID-19 vaccine is unknown. The performance of the test has not been established in COVID-19 vaccinees. False negative results for the test may occur if the individual's antibodies have not reached a sufficient level for the test to be able to detect them.  Antibodies can take up to two to three weeks (sometimes longer) to develop after someone is infected.  How long antibodies to SARS-CoV-2 last after infection is not known. . This test should not be used to diagnose an active SARS-CoV-2 infection. If an active infection is suspected, direct molecular or antigen testing for SARS -CoV-2 is recommended. Please review the "Fact Sheets" available for healthcare providers and  patients using the following websites: https://www.[facility].com/home/Covid-19/HCP/ antibody/fact-sheet7 https://www.[facility].com/home/Covid-19/ Patients/antibody/fact-sheet7 . Healthcare Providers: For additional information please refer to http://education.[facility].com/ faq/FAQ219(This link is being provided for informational/educational purposes only.) . This test has been authorized by the FDA under an Emergency Use Authorization(EUA)for use by authorized laboratories. The FDA authorized labeling is available on the [employer] website: www.[facility]. com/Covid19. .   FSH/LH     Status: None   Collection Time: 09/16/19 10:28 AM  Result Value Ref Range   FSH 8.5 mIU/mL    Comment:                     Reference Range .              Follicular Phase       7.2-09.4              Mid-cycle Peak         3.1-17.7              Luteal Phase           1.5- 9.1               Postmenopausal       23.0-116.3              .    LH 9.9 mIU/mL    Comment:     Reference Range Follicular Phase  7.0-96.2 Mid-Cycle Peak    8.7-76.3 Luteal Phase      0.5-16.9 Postmenopausal    10.0-54.7     -------------------------------------------------------------------------- A&P:  Problem List Items Addressed This Visit  Other   COVID-19    COVID-19 +, sx onset 8/22, tested 8/23, results received 8/24 as positive.  Has been taking sudafed, green tea, chamomile tea, increased her water intake, taking zinc, vitamin C and elderberry.  Her ENT provided sent in an rx for azithromycin.  Has been improving with symptoms but has started coughing in the past 2 days with a brownish sputum.  Has had some upper back tightness but no DOE, SOB, dizziness or fevers, likely MSK due to coughing.  Will continue symptom management and to switch from sudafed to mucinex to help break up the mucus.  Has had some increased anxiety with this diagnosis, will send in refill on her lorazepam.  Plan: 1. Switch from sudafed to mucinex. 2. Continued increased water intake and OTC supplements 3. RTC as needed and contact us if symptoms worsen      Relevant Medications   azithromycin (ZITHROMAX) 250 MG tablet    Other Visit Diagnoses    Severe anxiety with panic       Relevant Medications   LORazepam (ATIVAN) 0.5 MG tablet      Meds ordered this encounter  Medications  . LORazepam (ATIVAN) 0.5 MG tablet    Sig: Take 1 tablet (0.5 mg total) by mouth at bedtime as needed for anxiety.    Dispense:  30 tablet    Refill:  0    Follow-up: - Return to clinic as needed if symptoms worsen or fail to improve  Patient verbalizes understanding with the above medical recommendations including the limitation of remote medical advice.  Specific follow-up and call-back criteria were given for patient to follow-up or seek medical care more urgently if needed.  - Time spent in direct consultation with  patient on phone: 8 minutes  Harlin Rain, [facility] 10/17/2019, 10:17 AM

## 2019-10-21 ENCOUNTER — Encounter: Payer: Self-pay | Admitting: Family Medicine

## 2019-10-21 ENCOUNTER — Ambulatory Visit (INDEPENDENT_AMBULATORY_CARE_PROVIDER_SITE_OTHER): Payer: BC Managed Care – PPO | Admitting: Family Medicine

## 2019-10-21 ENCOUNTER — Other Ambulatory Visit: Payer: Self-pay

## 2019-10-21 VITALS — BP 94/48 | HR 73 | Temp 98.5°F | Ht 66.0 in | Wt 171.6 lb

## 2019-10-21 DIAGNOSIS — U071 COVID-19: Secondary | ICD-10-CM

## 2019-10-21 DIAGNOSIS — R059 Cough, unspecified: Secondary | ICD-10-CM | POA: Insufficient documentation

## 2019-10-21 DIAGNOSIS — R062 Wheezing: Secondary | ICD-10-CM | POA: Diagnosis not present

## 2019-10-21 DIAGNOSIS — R05 Cough: Secondary | ICD-10-CM

## 2019-10-21 MED ORDER — PREDNISONE 20 MG PO TABS: 40.0000 mg | ORAL_TABLET | Freq: Every day | ORAL | 0 refills | Status: AC

## 2019-10-21 MED ORDER — ALBUTEROL SULFATE HFA 108 (90 BASE) MCG/ACT IN AERS: 1.0000 | INHALATION_SPRAY | Freq: Four times a day (QID) | RESPIRATORY_TRACT | 1 refills | Status: DC | PRN

## 2019-10-21 NOTE — Assessment & Plan Note (Signed)
See cough A/P 

## 2019-10-21 NOTE — Assessment & Plan Note (Addendum)
11 days post COVID diagnosis, with continued productive cough and slight wheeze in left lung fields.  Has refill on her azithromycin from her ENT provider she had met with.  Will place on prednisone 40mg x 5 days and send in albuterol inhaler.  Encouraged to pick up incentive spirometer to help with increasing lung volumes while recovering from respiratory infection.  Plan: 1. Begin prednisone 40mg daily x 5 days 2. Begin albuterol inhaler 1-2 puffs every 4-6 hours as needed for shortness of breath, wheezing and/or cough 3. Increase fluids and begin to use incentive spirometer 4. RTC if symptoms worsen or fail to improve 

## 2019-10-21 NOTE — Patient Instructions (Signed)
As we discussed, your left lung sounds have a slight expiratory wheeze.  I have sent in a prescription for prednisone to take 40mg  daily for the next 5 days.  I have sent in a prescription for a ProAir Albuterol inhaler to take 1-2 puffs every 4-6 hours as needed for cough, shortness of breath and/or wheezing.  The antibiotic prescription that you received from urgent care, you can start that today.  Can look into an incentive spirometer (can get these quickly off [employer]).  They help with increasing lung volume after a respiratory illness.  We will plan to see you back if your symptoms worsen or fail to improve  You will receive a survey after today's visit either digitally by e-mail or paper by USPS mail. Your experiences and feedback matter to .  Please respond so we know how we are doing as we provide care for you.  Call us with any questions/concerns/needs.  It is my goal to be available to you for your health concerns.  Thanks for choosing me to be a partner in your healthcare needs!  [place], FNP-C Family Nurse Practitioner [facility] Phone: [phone]

## 2019-10-21 NOTE — Progress Notes (Signed)
Subjective:    Patient ID: Laura Dodson, female    DOB: 1980/03/18, 39 y.o.   MRN: [id number]  Laura Dodson is a 39 y.o. female presenting on 10/21/2019 for Cough (productive cough. Pt would like someone to listen to her lungs, because when she take a deep breath she still have a persistent cough. Diagnose with COVID x 11 days ago)   HPI  Laura Dodson presents to clinic for concerns of productive cough.  Reports she had been diagnosed with COVID approx 11 days ago, since she has had a persistent cough when she takes a deep breath.  Is concerned that she may have something going on in her lungs.  Has completed a course of azithromycin prescribed by her ENT.  Denies fevers, sore throat, change in taste/smell, CP, abdominal pain, n/v/d.  Depression screen [facility] 2/9 09/16/2019 05/04/2018 09/07/2017  Decreased Interest 0 0 0  Down, Depressed, Hopeless 0 0 0  PHQ - 2 Score 0 0 0  Altered sleeping 1 0 0  Tired, decreased energy 1 0 0  Change in appetite 0 0 0  Feeling bad or failure about yourself  0 0 0  Trouble concentrating 0 0 3  Moving slowly or fidgety/restless 0 0 0  Suicidal thoughts 0 0 0  PHQ-9 Score 2 0 3  Difficult doing work/chores Not difficult at all Not difficult at all Not difficult at all    Social History   Tobacco Use  . Smoking status: Former Smoker    Packs/day: 0.50    Years: 15.00    Pack years: 7.50    Quit date: 2012    Years since quitting: 9.6  . Smokeless tobacco: Never Used  Vaping Use  . Vaping Use: Never used  Substance Use Topics  . Alcohol use: Yes    Comment: 2-3  . Drug use: No    Review of Systems  Constitutional: Negative.   HENT: Negative.   Eyes: Negative.   Respiratory: Positive for cough and wheezing. Negative for apnea, choking, chest tightness, shortness of breath and stridor.   Cardiovascular: Negative.   Gastrointestinal: Negative.   Endocrine: Negative.   Genitourinary: Negative.   Musculoskeletal: Negative.   Skin: Negative.    Allergic/Immunologic: Negative.   Neurological: Negative.   Hematological: Negative.   Psychiatric/Behavioral: Negative.    Per HPI unless specifically indicated above     Objective:    BP (!) 94/48 (BP Location: Left Arm, Patient Position: Sitting, Cuff Size: Normal)   Pulse 73   Temp 98.5 F (36.9 C) (Oral)   Ht $R'5\' 6"'um$  (1.676 m)   Wt 171 lb 9.6 oz (77.8 kg)   SpO2 99%   BMI 27.70 kg/m   Wt Readings from Last 3 Encounters:  10/21/19 171 lb 9.6 oz (77.8 kg)  09/16/19 172 lb 9.6 oz (78.3 kg)  05/04/18 166 lb 6.4 oz (75.5 kg)    Physical Exam Vitals reviewed.  Constitutional:      General: She is not in acute distress.    Appearance: Normal appearance. She is well-developed, well-groomed and overweight. She is not ill-appearing or toxic-appearing.  HENT:     Head: Normocephalic and atraumatic.     Nose:     Comments: Lizbeth Bark is in place, covering mouth and nose. Eyes:     General: Lids are normal. Vision grossly intact.        Right eye: No discharge.        Left eye: No discharge.  Extraocular Movements: Extraocular movements intact.     Conjunctiva/sclera: Conjunctivae normal.     Pupils: Pupils are equal, round, and reactive to light.  Cardiovascular:     Rate and Rhythm: Normal rate and regular rhythm.     Pulses: Normal pulses.          Dorsalis pedis pulses are 2+ on the right side and 2+ on the left side.     Heart sounds: Normal heart sounds. No murmur heard.  No friction rub. No gallop.   Pulmonary:     Effort: Pulmonary effort is normal. No respiratory distress.     Breath sounds: No stridor. Wheezing present. No rhonchi or rales.     Comments: Expiratory wheeze left lung fields Skin:    General: Skin is warm and dry.     Capillary Refill: Capillary refill takes less than 2 seconds.  Neurological:     General: No focal deficit present.     Mental Status: She is alert and oriented to person, place, and time.  Psychiatric:        Attention and  Perception: Attention and perception normal.        Mood and Affect: Mood and affect normal.        Speech: Speech normal.        Behavior: Behavior normal. Behavior is cooperative.        Thought Content: Thought content normal.        Cognition and Memory: Cognition and memory normal.        Judgment: Judgment normal.    Results for orders placed or performed in visit on 09/16/19  CBC with Differential  Result Value Ref Range   WBC 4.6 3.8 - 10.8 Thousand/uL   RBC 4.19 3.80 - 5.10 Million/uL   Hemoglobin 13.6 11.7 - 15.5 g/dL   HCT 41.8 35 - 45 %   MCV 99.8 80.0 - 100.0 fL   MCH 32.5 27.0 - 33.0 pg   MCHC 32.5 32.0 - 36.0 g/dL   RDW 12.0 11.0 - 15.0 %   Platelets 185 140 - 400 Thousand/uL   MPV 10.4 7.5 - 12.5 fL   Neutro Abs 2,438 1,500 - 7,800 cells/uL   Lymphs Abs 1,780 850 - 3,900 cells/uL   Absolute Monocytes 304 200 - 950 cells/uL   Eosinophils Absolute 60 15 - 500 cells/uL   Basophils Absolute 18 0 - 200 cells/uL   Neutrophils Relative % 53 %   Total Lymphocyte 38.7 %   Monocytes Relative 6.6 %   Eosinophils Relative 1.3 %   Basophils Relative 0.4 %  COMPLETE METABOLIC PANEL WITH GFR  Result Value Ref Range   Glucose, Bld 86 65 - 99 mg/dL   BUN 11 7 - 25 mg/dL   Creat 0.69 0.50 - 1.10 mg/dL   GFR, Est Non African American 109 > OR = 60 mL/min/1.2m2   GFR, Est African American 126 > OR = 60 mL/min/1.90m2   BUN/Creatinine Ratio NOT APPLICABLE 6 - 22 (calc)   Sodium 138 135 - 146 mmol/L   Potassium 4.4 3.5 - 5.3 mmol/L   Chloride 104 98 - 110 mmol/L   CO2 28 20 - 32 mmol/L   Calcium 8.8 8.6 - 10.2 mg/dL   Total Protein 6.9 6.1 - 8.1 g/dL   Albumin 4.3 3.6 - 5.1 g/dL   Globulin 2.6 1.9 - 3.7 g/dL (calc)   AG Ratio 1.7 1.0 - 2.5 (calc)   Total Bilirubin 0.7 0.2 - 1.2 mg/dL  Alkaline phosphatase (APISO) 32 31 - 125 U/L   AST 22 10 - 30 U/L   ALT 10 6 - 29 U/L  Lipid Profile  Result Value Ref Range   Cholesterol 179 <200 mg/dL   HDL 69 > OR = 50 mg/dL    Triglycerides 78 <150 mg/dL   LDL Cholesterol (Calc) 93 mg/dL (calc)   Total CHOL/HDL Ratio 2.6 <5.0 (calc)   Non-HDL Cholesterol (Calc) 110 <130 mg/dL (calc)  Thyroid Panel With TSH  Result Value Ref Range   T3 Uptake 33 22 - 35 %   T4, Total 7.1 5.1 - 11.9 mcg/dL   Free Thyroxine Index 2.3 1.4 - 3.8   TSH 1.16 mIU/L  Hepatitis C Antibody  Result Value Ref Range   Hepatitis C Ab NON-REACTIVE NON-REACTI   SIGNAL TO CUT-OFF 0.01 <1.00  SARS-CoV-2 Semi-Quantitative Total Antibody, Spike  Result Value Ref Range   SARS COV2 AB, Total Spike Semi QN <0.4 <0.8 U/mL  [facility] HIV SCREENING LAB  Result Value Ref Range   [facility] HIV Screening Negative - Validated   FSH/LH  Result Value Ref Range   FSH 8.5 mIU/mL   LH 9.9 mIU/mL  POCT Urinalysis Dipstick  Result Value Ref Range   Color, UA yellow    Clarity, UA clear    Glucose, UA Negative Negative   Bilirubin, UA negative    Ketones, UA negative    Spec Grav, UA <=1.005 (A) 1.010 - 1.025   Blood, UA negative    pH, UA 8.0 5.0 - 8.0   Protein, UA Negative Negative   Urobilinogen, UA negative (A) 0.2 or 1.0 E.U./dL   Nitrite, UA negative    Leukocytes, UA Negative Negative   Appearance     Odor        Assessment & Plan:   Problem List Items Addressed This Visit      Other   COVID-19    See cough A/P      Wheezing    See cough A/P      Relevant Medications   predniSONE (DELTASONE) 20 MG tablet   albuterol (PROAIR HFA) 108 (90 Base) MCG/ACT inhaler   Cough - Primary    11 days post COVID diagnosis, with continued productive cough and slight wheeze in left lung fields.  Has refill on her azithromycin from her ENT provider she had met with.  Will place on prednisone $RemoveBefor'40mg'atppdAwLZjTE$  x 5 days and send in albuterol inhaler.  Encouraged to pick up incentive spirometer to help with increasing lung volumes while recovering from respiratory infection.  Plan: 1. Begin prednisone $RemoveBeforeDE'40mg'lPdnpyNDCRGpZyl$  daily x 5 days 2. Begin albuterol inhaler 1-2 puffs every 4-6  hours as needed for shortness of breath, wheezing and/or cough 3. Increase fluids and begin to use incentive spirometer 4. RTC if symptoms worsen or fail to improve      Relevant Medications   predniSONE (DELTASONE) 20 MG tablet      Meds ordered this encounter  Medications  . predniSONE (DELTASONE) 20 MG tablet    Sig: Take 2 tablets (40 mg total) by mouth daily with breakfast for 5 days.    Dispense:  10 tablet    Refill:  0  . albuterol (PROAIR HFA) 108 (90 Base) MCG/ACT inhaler    Sig: Inhale 1-2 puffs into the lungs every 6 (six) hours as needed for wheezing or shortness of breath.    Dispense:  6.7 g    Refill:  1    Follow  up plan: Return if symptoms worsen or fail to improve.   Harlin Rain, [place] [facility] Nurse Practitioner [facility] 10/21/2019, 8:43 AM

## 2019-11-23 ENCOUNTER — Ambulatory Visit (INDEPENDENT_AMBULATORY_CARE_PROVIDER_SITE_OTHER): Payer: BC Managed Care – PPO | Admitting: Family Medicine

## 2019-11-23 ENCOUNTER — Other Ambulatory Visit: Payer: Self-pay

## 2019-11-23 ENCOUNTER — Encounter: Payer: Self-pay | Admitting: Family Medicine

## 2019-11-23 VITALS — BP 109/45 | HR 59 | Temp 98.4°F | Ht 66.0 in | Wt 172.8 lb

## 2019-11-23 DIAGNOSIS — F909 Attention-deficit hyperactivity disorder, unspecified type: Secondary | ICD-10-CM | POA: Diagnosis not present

## 2019-11-23 DIAGNOSIS — R200 Anesthesia of skin: Secondary | ICD-10-CM | POA: Diagnosis not present

## 2019-11-23 DIAGNOSIS — Z0184 Encounter for antibody response examination: Secondary | ICD-10-CM

## 2019-11-23 DIAGNOSIS — R4189 Other symptoms and signs involving cognitive functions and awareness: Secondary | ICD-10-CM

## 2019-11-23 DIAGNOSIS — R202 Paresthesia of skin: Secondary | ICD-10-CM

## 2019-11-23 MED ORDER — ATOMOXETINE HCL 25 MG PO CAPS: 25.0000 mg | ORAL_CAPSULE | Freq: Every day | ORAL | 1 refills | Status: DC

## 2019-11-23 NOTE — Progress Notes (Signed)
Subjective:    Patient ID: Laura Dodson, female    DOB: 11/22/1980, 39 y.o.   MRN: [id number]  Laura Dodson is a 39 y.o. female presenting on 11/23/2019 for Post Covid (pt complains intermittent bilateral tingling feeling in her legs and arms. Most of the tingling is from the knee down the legs. Brain fog post COVID w/ memory loss. )   HPI  Ms. Granderson presents to clinic with concerns of post-COVID brain fog with memory loss.  Has been having intermittent tingling in her arms and legs, is sporadic when it happens, does not happen all at once, no previous cervical or lumbar pathology.  Has concerns for meeting with a neurologist to ensure this is not organic in pathology relating to her brain and would like a referral for evaluation.  Has concerns that she has had ADHD for a long time, was previously on Adderall at one point and tolerated well but did not tolerate being off of the medication.  Is interested in starting on non-controlled medications to see if this helps her attention and brain fog.  Depression screen [facility] 2/9 09/16/2019 05/04/2018 09/07/2017  Decreased Interest 0 0 0  Down, Depressed, Hopeless 0 0 0  PHQ - 2 Score 0 0 0  Altered sleeping 1 0 0  Tired, decreased energy 1 0 0  Change in appetite 0 0 0  Feeling bad or failure about yourself  0 0 0  Trouble concentrating 0 0 3  Moving slowly or fidgety/restless 0 0 0  Suicidal thoughts 0 0 0  PHQ-9 Score 2 0 3  Difficult doing work/chores Not difficult at all Not difficult at all Not difficult at all    Social History   Tobacco Use  . Smoking status: Former Smoker    Packs/day: 0.50    Years: 15.00    Pack years: 7.50    Quit date: 2012    Years since quitting: 9.7  . Smokeless tobacco: Never Used  Vaping Use  . Vaping Use: Never used  Substance Use Topics  . Alcohol use: Yes    Comment: 2-3  . Drug use: No    Review of Systems  Constitutional: Negative.   HENT: Negative.   Eyes: Negative.   Respiratory:  Negative.   Cardiovascular: Negative.   Gastrointestinal: Negative.   Endocrine: Negative.   Genitourinary: Negative.   Musculoskeletal: Negative.   Skin: Negative.   Allergic/Immunologic: Negative.   Neurological: Positive for numbness. Negative for dizziness, tremors, seizures, syncope, facial asymmetry, speech difficulty, weakness, light-headedness and headaches.       Brain fog  Hematological: Negative.   Psychiatric/Behavioral: Negative.    Per HPI unless specifically indicated above     Objective:    BP (!) 109/45 (BP Location: Left Arm, Patient Position: Sitting, Cuff Size: Normal)   Pulse (!) 59   Temp 98.4 F (36.9 C) (Oral)   Ht 5\' 6"  (1.676 m)   Wt 172 lb 12.8 oz (78.4 kg)   BMI 27.89 kg/m   Wt Readings from Last 3 Encounters:  11/23/19 172 lb 12.8 oz (78.4 kg)  10/21/19 171 lb 9.6 oz (77.8 kg)  09/16/19 172 lb 9.6 oz (78.3 kg)    Physical Exam Vitals and nursing note reviewed.  Constitutional:      General: She is not in acute distress.    Appearance: Normal appearance. She is well-developed, well-groomed and overweight. She is not ill-appearing or toxic-appearing.  HENT:     Head: Normocephalic and atraumatic.  Nose:     Comments: Lesia Sago is in place, covering mouth and nose. Eyes:     General: Lids are normal. Vision grossly intact.        Right eye: No discharge.        Left eye: No discharge.     Extraocular Movements: Extraocular movements intact.     Conjunctiva/sclera: Conjunctivae normal.     Pupils: Pupils are equal, round, and reactive to light.  Cardiovascular:     Pulses: Normal pulses.  Pulmonary:     Effort: Pulmonary effort is normal. No respiratory distress.  Musculoskeletal:     Right lower leg: No edema.     Left lower leg: No edema.  Skin:    General: Skin is warm and dry.     Capillary Refill: Capillary refill takes less than 2 seconds.  Neurological:     General: No focal deficit present.     Mental Status: She is alert  and oriented to person, place, and time.     Cranial Nerves: No cranial nerve deficit.     Sensory: No sensory deficit.     Motor: No weakness.     Gait: Gait normal.  Psychiatric:        Attention and Perception: Attention and perception normal.        Mood and Affect: Mood and affect normal.        Speech: Speech normal.        Behavior: Behavior normal. Behavior is cooperative.        Thought Content: Thought content normal.        Cognition and Memory: Cognition and memory normal.        Judgment: Judgment normal.    Results for orders placed or performed in visit on 09/16/19  CBC with Differential  Result Value Ref Range   WBC 4.6 3.8 - 10.8 Thousand/uL   RBC 4.19 3.80 - 5.10 Million/uL   Hemoglobin 13.6 11.7 - 15.5 g/dL   HCT 65.7 35 - 45 %   MCV 99.8 80.0 - 100.0 fL   MCH 32.5 27.0 - 33.0 pg   MCHC 32.5 32.0 - 36.0 g/dL   RDW 84.6 96.2 - 95.2 %   Platelets 185 140 - 400 Thousand/uL   MPV 10.4 7.5 - 12.5 fL   Neutro Abs 2,438 1,500 - 7,800 cells/uL   Lymphs Abs 1,780 850 - 3,900 cells/uL   Absolute Monocytes 304 200 - 950 cells/uL   Eosinophils Absolute 60 15 - 500 cells/uL   Basophils Absolute 18 0 - 200 cells/uL   Neutrophils Relative % 53 %   Total Lymphocyte 38.7 %   Monocytes Relative 6.6 %   Eosinophils Relative 1.3 %   Basophils Relative 0.4 %  COMPLETE METABOLIC PANEL WITH GFR  Result Value Ref Range   Glucose, Bld 86 65 - 99 mg/dL   BUN 11 7 - 25 mg/dL   Creat 8.41 3.24 - 4.01 mg/dL   GFR, Est Non African American 109 > OR = 60 mL/min/1.83m2   GFR, Est African American 126 > OR = 60 mL/min/1.81m2   BUN/Creatinine Ratio NOT APPLICABLE 6 - 22 (calc)   Sodium 138 135 - 146 mmol/L   Potassium 4.4 3.5 - 5.3 mmol/L   Chloride 104 98 - 110 mmol/L   CO2 28 20 - 32 mmol/L   Calcium 8.8 8.6 - 10.2 mg/dL   Total Protein 6.9 6.1 - 8.1 g/dL   Albumin 4.3 3.6 - 5.1 g/dL  Globulin 2.6 1.9 - 3.7 g/dL (calc)   AG Ratio 1.7 1.0 - 2.5 (calc)   Total Bilirubin 0.7  0.2 - 1.2 mg/dL   Alkaline phosphatase (APISO) 32 31 - 125 U/L   AST 22 10 - 30 U/L   ALT 10 6 - 29 U/L  Lipid Profile  Result Value Ref Range   Cholesterol 179 <200 mg/dL   HDL 69 > OR = 50 mg/dL   Triglycerides 78 <098<150 mg/dL   LDL Cholesterol (Calc) 93 mg/dL (calc)   Total CHOL/HDL Ratio 2.6 <5.0 (calc)   Non-HDL Cholesterol (Calc) 110 <130 mg/dL (calc)  Thyroid Panel With TSH  Result Value Ref Range   T3 Uptake 33 22 - 35 %   T4, Total 7.1 5.1 - 11.9 mcg/dL   Free Thyroxine Index 2.3 1.4 - 3.8   TSH 1.16 mIU/L  Hepatitis C Antibody  Result Value Ref Range   Hepatitis C Ab NON-REACTIVE NON-REACTI   SIGNAL TO CUT-OFF 0.01 <1.00  SARS-CoV-2 Semi-Quantitative Total Antibody, Spike  Result Value Ref Range   SARS COV2 AB, Total Spike Semi QN <0.4 <0.8 U/mL  HM HIV SCREENING LAB  Result Value Ref Range   HM HIV Screening Negative - Validated   FSH/LH  Result Value Ref Range   FSH 8.5 mIU/mL   LH 9.9 mIU/mL  POCT Urinalysis Dipstick  Result Value Ref Range   Color, UA yellow    Clarity, UA clear    Glucose, UA Negative Negative   Bilirubin, UA negative    Ketones, UA negative    Spec Grav, UA <=1.005 (A) 1.010 - 1.025   Blood, UA negative    pH, UA 8.0 5.0 - 8.0   Protein, UA Negative Negative   Urobilinogen, UA negative (A) 0.2 or 1.0 E.U./dL   Nitrite, UA negative    Leukocytes, UA Negative Negative   Appearance     Odor        Assessment & Plan:   Problem List Items Addressed This Visit      Other   Immunity status testing    Requesting COVID antibody testing.  Order placed.      Relevant Orders   SARS-CoV-2 Semi-Quantitative Total Antibody, Spike   Brain fog - Primary    Likely post-COVID brain fog.  Discussed can take months to resolve.  Patient has some anxiety, had a family member pass away due to melanoma in the past and would like to meet with Neurology to make sure this isn't due to another cause with her brain.  Will place referral.  Discussed  options of referral to speech-language pathology as well as neuropsychiatry, will defer those referrals for now.  Plan: 1. Referral to Neurology placed      Relevant Orders   Ambulatory referral to Neurology   Numbness and tingling    Intermittent numbness tingling in bilateral arms and legs, sometimes is present in one limb, nothing makes better/worse, just comes and goes.  Discussed can be related to anxiety but is possible needs an EMG to look for nerve disturbance and best done with Neurology.  Plan: 1. Referral to Neurology placed      Relevant Orders   Ambulatory referral to Neurology   Attention deficit hyperactivity disorder (ADHD)    Previously treated with Adderall in the past.  Discussed option to start slowly with Strattera to see if this is of help with her ADHD, attention and focus.  Discussed can stop at any time, does not  require titration down.  Patient in agreement for trial of strattera.  Plan: 1. Begin strattera 25mg  daily 2. Work on strategies and activities to work on focus and attention 3. RTC in 3 months for re-evaluation      Relevant Medications   atomoxetine (STRATTERA) 25 MG capsule      Meds ordered this encounter  Medications  . atomoxetine (STRATTERA) 25 MG capsule    Sig: Take 1 capsule (25 mg total) by mouth daily.    Dispense:  30 capsule    Refill:  1    Follow up plan: Return in about 3 months (around 02/23/2020) for Brain fog f/u & ADHD F/U.   04/22/2020, FNP Family Nurse Practitioner [facility] 11/23/2019, 11:36 AM

## 2019-11-23 NOTE — Patient Instructions (Addendum)
Post-COVID Memory Concerns:  Psychological and cognitive complaints are also common during recovery from acute COVID-19 and may be seen more commonly than in those recovering from similar illnesses. In one study of 100 patients with acute COVID-19 who were discharged from the hospital, 24 percent reported PTSD, 18 percent had new or worsened problems with memory, and 16 percent had new or worsened problems with concentration; numbers were higher among patients admitted to the intensive care unit (ICU). In other studies, almost one-half of COVID-19 survivors reported a worsened quality of life, 22 percent had anxiety/depression, and 23 percent of patients were found to have persistent psychological symptoms at three months.   Psychological complaints may be seen more commonly than in those recovering from similar illnesses. As an example, a retrospective examination of electronic health records in the [place] reported that the risk of developing a new psychiatric illness following COVID-19 was higher compared with those recovering from other medical illnesses such as influenza.  Data suggests that concentration and memory problems persist for 6 weeks or more in COVID-19 patients.  We can place a referral to neuropsychiatry or speech-language pathology for evaluation and management.  I had sent in a prescription for Strattera 25mg  to take 1 tablet daily.  We can start this trial and let me know how you do on this.  A referral to Neurology has been placed today.  If you have not heard from the specialty office or our referral coordinator within 1 week, please let know and we will follow up with the referral coordinator for an update.  We will plan to see you back in 3 months for ADHD and Brain Fog f/u  You will receive a survey after today's visit either digitally by e-mail or paper by USPS mail. Your experiences and feedback matter to [place].  Please respond so we know how we are doing as we provide  care for you.  Call us with any questions/concerns/needs.  It is my goal to be available to you for your health concerns.  Thanks for choosing me to be a partner in your healthcare needs!  [place], FNP-C Family Nurse Practitioner [facility] Phone: [phone]

## 2019-11-24 DIAGNOSIS — R4189 Other symptoms and signs involving cognitive functions and awareness: Secondary | ICD-10-CM | POA: Insufficient documentation

## 2019-11-24 DIAGNOSIS — F909 Attention-deficit hyperactivity disorder, unspecified type: Secondary | ICD-10-CM | POA: Insufficient documentation

## 2019-11-24 DIAGNOSIS — R202 Paresthesia of skin: Secondary | ICD-10-CM | POA: Insufficient documentation

## 2019-11-24 DIAGNOSIS — R200 Anesthesia of skin: Secondary | ICD-10-CM | POA: Insufficient documentation

## 2019-11-24 NOTE — Assessment & Plan Note (Signed)
Requesting COVID antibody testing.  Order placed.

## 2019-11-24 NOTE — Assessment & Plan Note (Signed)
Intermittent numbness tingling in bilateral arms and legs, sometimes is present in one limb, nothing makes better/worse, just comes and goes.  Discussed can be related to anxiety but is possible needs an EMG to look for nerve disturbance and best done with Neurology.  Plan: 1. Referral to Neurology placed

## 2019-11-24 NOTE — Assessment & Plan Note (Signed)
Likely post-COVID brain fog.  Discussed can take months to resolve.  Patient has some anxiety, had a family member pass away due to melanoma in the past and would like to meet with Neurology to make sure this isn't due to another cause with her brain.  Will place referral.  Discussed options of referral to speech-language pathology as well as neuropsychiatry, will defer those referrals for now.  Plan: 1. Referral to Neurology placed

## 2019-11-24 NOTE — Assessment & Plan Note (Signed)
Previously treated with Adderall in the past.  Discussed option to start slowly with Strattera to see if this is of help with her ADHD, attention and focus.  Discussed can stop at any time, does not require titration down.  Patient in agreement for trial of strattera.  Plan: 1. Begin strattera 25mg  daily 2. Work on strategies and activities to work on focus and attention 3. RTC in 3 months for re-evaluation

## 2019-11-28 ENCOUNTER — Encounter: Payer: Self-pay | Admitting: Family Medicine

## 2019-11-30 LAB — SARS-COV-2 SEMI-QUANTITATIVE TOTAL ANTIBODY, SPIKE: SARS COV2 AB, Total Spike Semi QN: 587.5 U/mL — ABNORMAL HIGH (ref <0.8)

## 2019-12-27 ENCOUNTER — Other Ambulatory Visit: Payer: Self-pay | Admitting: Chiropractic Medicine

## 2019-12-27 ENCOUNTER — Other Ambulatory Visit: Payer: Self-pay

## 2019-12-27 ENCOUNTER — Ambulatory Visit
Admission: RE | Admit: 2019-12-27 | Discharge: 2019-12-27 | Disposition: A | Discharge status: Home / Self Care | Payer: BC Managed Care – PPO | Source: Ambulatory Visit | Attending: Chiropractic Medicine | Admitting: Chiropractic Medicine

## 2019-12-27 ENCOUNTER — Ambulatory Visit
Admission: RE | Admit: 2019-12-27 | Discharge: 2019-12-27 | Disposition: A | Discharge status: Home / Self Care | Payer: BC Managed Care – PPO | Attending: Chiropractic Medicine | Admitting: Chiropractic Medicine

## 2019-12-27 DIAGNOSIS — R202 Paresthesia of skin: Secondary | ICD-10-CM | POA: Insufficient documentation

## 2019-12-27 DIAGNOSIS — R200 Anesthesia of skin: Secondary | ICD-10-CM | POA: Diagnosis not present

## 2019-12-27 DIAGNOSIS — M9902 Segmental and somatic dysfunction of thoracic region: Secondary | ICD-10-CM

## 2020-01-05 DIAGNOSIS — I83893 Varicose veins of bilateral lower extremities with other complications: Secondary | ICD-10-CM | POA: Diagnosis not present

## 2020-01-16 ENCOUNTER — Other Ambulatory Visit (HOSPITAL_COMMUNITY): Payer: Self-pay | Admitting: Neurology

## 2020-01-16 ENCOUNTER — Other Ambulatory Visit: Payer: Self-pay | Admitting: Neurology

## 2020-01-16 DIAGNOSIS — E519 Thiamine deficiency, unspecified: Secondary | ICD-10-CM | POA: Diagnosis not present

## 2020-01-16 DIAGNOSIS — Z7189 Other specified counseling: Secondary | ICD-10-CM | POA: Diagnosis not present

## 2020-01-16 DIAGNOSIS — E538 Deficiency of other specified B group vitamins: Secondary | ICD-10-CM | POA: Diagnosis not present

## 2020-01-16 DIAGNOSIS — G629 Polyneuropathy, unspecified: Secondary | ICD-10-CM | POA: Diagnosis not present

## 2020-01-16 DIAGNOSIS — Z131 Encounter for screening for diabetes mellitus: Secondary | ICD-10-CM | POA: Diagnosis not present

## 2020-01-16 DIAGNOSIS — R2990 Unspecified symptoms and signs involving the nervous system: Secondary | ICD-10-CM | POA: Diagnosis not present

## 2020-01-16 DIAGNOSIS — U099 Post covid-19 condition, unspecified: Secondary | ICD-10-CM | POA: Diagnosis not present

## 2020-01-16 DIAGNOSIS — E531 Pyridoxine deficiency: Secondary | ICD-10-CM | POA: Diagnosis not present

## 2020-01-18 ENCOUNTER — Other Ambulatory Visit: Payer: Self-pay | Admitting: Family Medicine

## 2020-01-18 DIAGNOSIS — F909 Attention-deficit hyperactivity disorder, unspecified type: Secondary | ICD-10-CM

## 2020-01-18 NOTE — Telephone Encounter (Signed)
Requested medication (s) are due for refill today: due 01/20/20  Requested medication (s) are on the active medication list: yes  Last refill: 11/23/19  #30 1 refill  Future visit scheduled  no  Notes to clinic: not delegated  Requested Prescriptions  Pending Prescriptions Disp Refills   atomoxetine (STRATTERA) 25 MG capsule [Pharmacy Med Name: ATOMOXETINE HCL 25 MG CAPSULE] 30 capsule 1    Sig: TAKE 1 CAPSULE BY MOUTH EVERY DAY      Not Delegated - Psychiatry: Norepinephrine Reuptake Inhibitor Failed - 01/18/2020  2:29 AM      Failed - This refill cannot be delegated      Passed - Valid encounter within last 6 months    Recent Outpatient Visits           1 month ago Brain fog   [facility], Jodelle Gross, FNP   2 months ago Cough   [facility], Jodelle Gross, FNP   3 months ago Severe anxiety with panic   [facility], Jodelle Gross, FNP   4 months ago Annual physical exam   [facility], Jodelle Gross, FNP   1 year ago Severe anxiety with panic   [facility] [place], NP

## 2020-01-24 ENCOUNTER — Encounter: Payer: Self-pay | Admitting: Family Medicine

## 2020-01-24 ENCOUNTER — Other Ambulatory Visit: Payer: Self-pay | Admitting: Family Medicine

## 2020-01-24 DIAGNOSIS — F410 Panic disorder [episodic paroxysmal anxiety] without agoraphobia: Secondary | ICD-10-CM

## 2020-01-24 MED ORDER — VALACYCLOVIR HCL 500 MG PO TABS
500.0000 mg | ORAL_TABLET | Freq: Two times a day (BID) | ORAL | 1 refills | Status: DC | PRN
Start: 2020-01-24 — End: 2020-12-17

## 2020-01-24 MED ORDER — LORAZEPAM 0.5 MG PO TABS: 0.5000 mg | ORAL_TABLET | Freq: Every evening | ORAL | 0 refills | Status: DC | PRN

## 2020-01-25 ENCOUNTER — Ambulatory Visit (HOSPITAL_COMMUNITY)
Admission: RE | Admit: 2020-01-25 | Discharge: 2020-01-25 | Disposition: A | Discharge status: Home / Self Care | Payer: BC Managed Care – PPO | Source: Ambulatory Visit | Attending: Neurology | Admitting: Neurology

## 2020-01-25 ENCOUNTER — Other Ambulatory Visit: Payer: Self-pay

## 2020-01-25 DIAGNOSIS — U099 Post covid-19 condition, unspecified: Secondary | ICD-10-CM | POA: Diagnosis not present

## 2020-01-25 DIAGNOSIS — R930 Abnormal findings on diagnostic imaging of skull and head, not elsewhere classified: Secondary | ICD-10-CM | POA: Diagnosis not present

## 2020-01-25 DIAGNOSIS — U071 COVID-19: Secondary | ICD-10-CM | POA: Diagnosis not present

## 2020-01-25 MED ORDER — GADOBUTROL 1 MMOL/ML IV SOLN
7.0000 mL | Freq: Once | INTRAVENOUS | Status: AC | PRN
  Administered 2020-01-25: 7 mL via INTRAVENOUS

## 2020-02-28 ENCOUNTER — Encounter: Payer: Self-pay | Admitting: Family Medicine

## 2020-03-01 DIAGNOSIS — Z6827 Body mass index (BMI) 27.0-27.9, adult: Secondary | ICD-10-CM | POA: Diagnosis not present

## 2020-03-01 DIAGNOSIS — Z01419 Encounter for gynecological examination (general) (routine) without abnormal findings: Secondary | ICD-10-CM | POA: Diagnosis not present

## 2020-03-01 DIAGNOSIS — Z1239 Encounter for other screening for malignant neoplasm of breast: Secondary | ICD-10-CM | POA: Diagnosis not present

## 2020-03-01 DIAGNOSIS — Z3201 Encounter for pregnancy test, result positive: Secondary | ICD-10-CM | POA: Diagnosis not present

## 2020-03-06 ENCOUNTER — Other Ambulatory Visit: Payer: Self-pay

## 2020-03-06 ENCOUNTER — Telehealth (INDEPENDENT_AMBULATORY_CARE_PROVIDER_SITE_OTHER): Payer: BC Managed Care – PPO | Admitting: Family Medicine

## 2020-03-06 ENCOUNTER — Encounter: Payer: Self-pay | Admitting: Family Medicine

## 2020-03-06 DIAGNOSIS — F909 Attention-deficit hyperactivity disorder, unspecified type: Secondary | ICD-10-CM

## 2020-03-06 NOTE — Progress Notes (Signed)
Virtual Visit via Telephone  The purpose of this virtual visit is to provide medical care while limiting exposure to the novel coronavirus (COVID19) for both patient and office staff.  Consent was obtained for phone visit:  Yes.   Answered questions that patient had about telehealth interaction:  Yes.   I discussed the limitations, risks, security and privacy concerns of performing an evaluation and management service by telephone. I also discussed with the patient that there may be a patient responsible charge related to this service. The patient expressed understanding and agreed to proceed.  Patient is at home and is accessed via telephone Services are provided by Charlaine Dalton, FNP-C from [facility])  ---------------------------------------------------------------------- Chief Complaint  Patient presents with  . Anxiety    S: Reviewed CMA documentation. I have called patient and gathered additional HPI as follows:  Laura Dodson presents for virtual telemedicine visit via telephone for follow up on ADHD.  Has recently stopped her strattera use due to new pregnancy.  Has continued concerns for ADHD.  Patient is currently home Denies any high risk travel to areas of current concern for COVID19. Denies any known or suspected exposure to person with or possibly with COVID19.  Past Medical History:  Diagnosis Date  . Anxiety    Social History   Tobacco Use  . Smoking status: Former Smoker    Packs/day: 0.50    Years: 15.00    Pack years: 7.50    Quit date: 2012    Years since quitting: 10.0  . Smokeless tobacco: Never Used  Vaping Use  . Vaping Use: Never used  Substance Use Topics  . Alcohol use: Not Currently    Comment: 2-3  . Drug use: No    Current Outpatient Medications:  .  Ascorbic Acid (VITAMIN C) 100 MG tablet, Take 100 mg by mouth daily., Disp: , Rfl:  .  cholecalciferol (VITAMIN D3) 25 MCG (1000 UNIT) tablet, Take 1,000 Units by  mouth daily., Disp: , Rfl:  .  ELDERBERRY PO, Take by mouth., Disp: , Rfl:  .  LORazepam (ATIVAN) 0.5 MG tablet, Take 1 tablet (0.5 mg total) by mouth at bedtime as needed for anxiety., Disp: 30 tablet, Rfl: 0 .  Prenatal Vit-Fe Fumarate-FA (PRENATAL MULTIVITAMIN) TABS tablet, Take 1 tablet by mouth daily at 12 noon., Disp: , Rfl:  .  valACYclovir (VALTREX) 500 MG tablet, Take 1 tablet (500 mg total) by mouth 2 (two) times daily as needed., Disp: 30 tablet, Rfl: 1 .  Zinc 100 MG TABS, Take by mouth., Disp: , Rfl:   Depression screen [facility] 2/9 09/16/2019 05/04/2018 09/07/2017  Decreased Interest 0 0 0  Down, Depressed, Hopeless 0 0 0  PHQ - 2 Score 0 0 0  Altered sleeping 1 0 0  Tired, decreased energy 1 0 0  Change in appetite 0 0 0  Feeling bad or failure about yourself  0 0 0  Trouble concentrating 0 0 3  Moving slowly or fidgety/restless 0 0 0  Suicidal thoughts 0 0 0  PHQ-9 Score 2 0 3  Difficult doing work/chores Not difficult at all Not difficult at all Not difficult at all    GAD 7 : Generalized Anxiety Score 09/16/2019 05/04/2018 09/07/2017  Nervous, Anxious, on Edge 0 0 0  Control/stop worrying 0 0 0  Worry too much - different things 0 0 0  Trouble relaxing 0 0 3  Restless 0 0 0  Easily annoyed or irritable 0  0 0  Afraid - awful might happen 0 0 0  Total GAD 7 Score 0 0 3  Anxiety Difficulty Not difficult at all Not difficult at all Not difficult at all    -------------------------------------------------------------------------- O: No physical exam performed due to remote telephone encounter.  Physical Exam: Patient remotely monitored without video.  Verbal communication appropriate.  Cognition normal.  No results found for this or any previous visit (from the past 2160 hour(s)).  -------------------------------------------------------------------------- A&P:  Problem List Items Addressed This Visit      Other   Attention deficit hyperactivity disorder (ADHD) -  Primary    Has stopped strattera due to new diagnosis of pregnancy.  To discuss concerns with OB/GYN and for recommendations of medications that can be safely used in pregnancy.         No orders of the defined types were placed in this encounter.   Follow-up: - To follow up with OB/GYN for treatment recommendations  Patient verbalizes understanding with the above medical recommendations including the limitation of remote medical advice.  Specific follow-up and call-back criteria were given for patient to follow-up or seek medical care more urgently if needed.  - Time spent in direct consultation with patient on phone: 11 minutes  Charlaine Dalton, FNP-C [facility] 03/06/2020, 2:06 PM

## 2020-03-06 NOTE — Assessment & Plan Note (Signed)
Has stopped strattera due to new diagnosis of pregnancy.  To discuss concerns with OB/GYN and for recommendations of medications that can be safely used in pregnancy.

## 2020-03-07 DIAGNOSIS — O209 Hemorrhage in early pregnancy, unspecified: Secondary | ICD-10-CM | POA: Diagnosis not present

## 2020-03-12 DIAGNOSIS — O200 Threatened abortion: Secondary | ICD-10-CM | POA: Diagnosis not present

## 2020-03-12 DIAGNOSIS — E538 Deficiency of other specified B group vitamins: Secondary | ICD-10-CM | POA: Diagnosis not present

## 2020-03-14 DIAGNOSIS — O200 Threatened abortion: Secondary | ICD-10-CM | POA: Diagnosis not present

## 2020-03-23 DIAGNOSIS — Z3481 Encounter for supervision of other normal pregnancy, first trimester: Secondary | ICD-10-CM | POA: Diagnosis not present

## 2020-03-23 DIAGNOSIS — O3680X1 Pregnancy with inconclusive fetal viability, fetus 1: Secondary | ICD-10-CM | POA: Diagnosis not present

## 2020-03-23 DIAGNOSIS — Z3689 Encounter for other specified antenatal screening: Secondary | ICD-10-CM | POA: Diagnosis not present

## 2020-03-23 DIAGNOSIS — Z3A01 Less than 8 weeks gestation of pregnancy: Secondary | ICD-10-CM | POA: Diagnosis not present

## 2020-04-10 DIAGNOSIS — Z1379 Encounter for other screening for genetic and chromosomal anomalies: Secondary | ICD-10-CM | POA: Diagnosis not present

## 2020-04-16 DIAGNOSIS — Z3689 Encounter for other specified antenatal screening: Secondary | ICD-10-CM | POA: Diagnosis not present

## 2020-04-16 DIAGNOSIS — O26851 Spotting complicating pregnancy, first trimester: Secondary | ICD-10-CM | POA: Diagnosis not present

## 2020-04-16 DIAGNOSIS — Z0183 Encounter for blood typing: Secondary | ICD-10-CM | POA: Diagnosis not present

## 2020-04-16 DIAGNOSIS — O360110 Maternal care for anti-D [Rh] antibodies, first trimester, not applicable or unspecified: Secondary | ICD-10-CM | POA: Diagnosis not present

## 2020-04-16 DIAGNOSIS — Z3A11 11 weeks gestation of pregnancy: Secondary | ICD-10-CM | POA: Diagnosis not present

## 2020-04-19 DIAGNOSIS — Z3689 Encounter for other specified antenatal screening: Secondary | ICD-10-CM | POA: Diagnosis not present

## 2020-04-19 DIAGNOSIS — Z113 Encounter for screening for infections with a predominantly sexual mode of transmission: Secondary | ICD-10-CM | POA: Diagnosis not present

## 2020-05-02 DIAGNOSIS — O418X19 Other specified disorders of amniotic fluid and membranes, first trimester, other fetus: Secondary | ICD-10-CM | POA: Diagnosis not present

## 2020-05-02 DIAGNOSIS — Z3A13 13 weeks gestation of pregnancy: Secondary | ICD-10-CM | POA: Diagnosis not present

## 2020-05-21 DIAGNOSIS — N3946 Mixed incontinence: Secondary | ICD-10-CM | POA: Diagnosis not present

## 2020-05-30 DIAGNOSIS — G4709 Other insomnia: Secondary | ICD-10-CM | POA: Diagnosis not present

## 2020-05-30 DIAGNOSIS — Z3689 Encounter for other specified antenatal screening: Secondary | ICD-10-CM | POA: Diagnosis not present

## 2020-06-13 DIAGNOSIS — Z3689 Encounter for other specified antenatal screening: Secondary | ICD-10-CM | POA: Diagnosis not present

## 2020-06-13 DIAGNOSIS — O09899 Supervision of other high risk pregnancies, unspecified trimester: Secondary | ICD-10-CM | POA: Diagnosis not present

## 2020-06-13 DIAGNOSIS — Z3A19 19 weeks gestation of pregnancy: Secondary | ICD-10-CM | POA: Diagnosis not present

## 2020-06-13 DIAGNOSIS — O09522 Supervision of elderly multigravida, second trimester: Secondary | ICD-10-CM | POA: Diagnosis not present

## 2020-06-21 DIAGNOSIS — O368121 Decreased fetal movements, second trimester, fetus 1: Secondary | ICD-10-CM | POA: Diagnosis not present

## 2020-06-21 DIAGNOSIS — Z3A20 20 weeks gestation of pregnancy: Secondary | ICD-10-CM | POA: Diagnosis not present

## 2020-06-28 DIAGNOSIS — R102 Pelvic and perineal pain: Secondary | ICD-10-CM | POA: Diagnosis not present

## 2020-08-07 DIAGNOSIS — Z0183 Encounter for blood typing: Secondary | ICD-10-CM | POA: Diagnosis not present

## 2020-08-07 DIAGNOSIS — K5909 Other constipation: Secondary | ICD-10-CM | POA: Diagnosis not present

## 2020-08-07 DIAGNOSIS — G2581 Restless legs syndrome: Secondary | ICD-10-CM | POA: Diagnosis not present

## 2020-08-07 DIAGNOSIS — Z3689 Encounter for other specified antenatal screening: Secondary | ICD-10-CM | POA: Diagnosis not present

## 2020-08-07 DIAGNOSIS — O360121 Maternal care for anti-D [Rh] antibodies, second trimester, fetus 1: Secondary | ICD-10-CM | POA: Diagnosis not present

## 2020-08-28 DIAGNOSIS — Z20822 Contact with and (suspected) exposure to covid-19: Secondary | ICD-10-CM | POA: Diagnosis not present

## 2020-09-04 DIAGNOSIS — Z3A31 31 weeks gestation of pregnancy: Secondary | ICD-10-CM | POA: Diagnosis not present

## 2020-09-04 DIAGNOSIS — O26843 Uterine size-date discrepancy, third trimester: Secondary | ICD-10-CM | POA: Diagnosis not present

## 2020-09-04 DIAGNOSIS — Z23 Encounter for immunization: Secondary | ICD-10-CM | POA: Diagnosis not present

## 2020-10-01 DIAGNOSIS — O368131 Decreased fetal movements, third trimester, fetus 1: Secondary | ICD-10-CM | POA: Diagnosis not present

## 2020-10-01 DIAGNOSIS — Z3A35 35 weeks gestation of pregnancy: Secondary | ICD-10-CM | POA: Diagnosis not present

## 2020-10-04 DIAGNOSIS — Z3A35 35 weeks gestation of pregnancy: Secondary | ICD-10-CM | POA: Diagnosis not present

## 2020-10-04 DIAGNOSIS — O09523 Supervision of elderly multigravida, third trimester: Secondary | ICD-10-CM | POA: Diagnosis not present

## 2020-10-11 DIAGNOSIS — Z3689 Encounter for other specified antenatal screening: Secondary | ICD-10-CM | POA: Diagnosis not present

## 2020-10-11 DIAGNOSIS — Z3A36 36 weeks gestation of pregnancy: Secondary | ICD-10-CM | POA: Diagnosis not present

## 2020-10-11 DIAGNOSIS — O09523 Supervision of elderly multigravida, third trimester: Secondary | ICD-10-CM | POA: Diagnosis not present

## 2020-10-11 DIAGNOSIS — O09529 Supervision of elderly multigravida, unspecified trimester: Secondary | ICD-10-CM | POA: Diagnosis not present

## 2020-10-11 DIAGNOSIS — Z3685 Encounter for antenatal screening for Streptococcus B: Secondary | ICD-10-CM | POA: Diagnosis not present

## 2020-10-18 DIAGNOSIS — Z3A37 37 weeks gestation of pregnancy: Secondary | ICD-10-CM | POA: Diagnosis not present

## 2020-10-18 DIAGNOSIS — O09523 Supervision of elderly multigravida, third trimester: Secondary | ICD-10-CM | POA: Diagnosis not present

## 2020-10-25 DIAGNOSIS — Z3A38 38 weeks gestation of pregnancy: Secondary | ICD-10-CM | POA: Diagnosis not present

## 2020-10-25 DIAGNOSIS — O09523 Supervision of elderly multigravida, third trimester: Secondary | ICD-10-CM | POA: Diagnosis not present

## 2020-10-29 DIAGNOSIS — Z20822 Contact with and (suspected) exposure to covid-19: Secondary | ICD-10-CM | POA: Diagnosis not present

## 2020-10-29 DIAGNOSIS — F419 Anxiety disorder, unspecified: Secondary | ICD-10-CM | POA: Diagnosis not present

## 2020-10-29 DIAGNOSIS — A6000 Herpesviral infection of urogenital system, unspecified: Secondary | ICD-10-CM | POA: Diagnosis not present

## 2020-10-29 DIAGNOSIS — Q381 Ankyloglossia: Secondary | ICD-10-CM | POA: Diagnosis not present

## 2020-10-29 DIAGNOSIS — Z3A41 41 weeks gestation of pregnancy: Secondary | ICD-10-CM | POA: Diagnosis not present

## 2020-10-29 DIAGNOSIS — O479 False labor, unspecified: Secondary | ICD-10-CM | POA: Diagnosis not present

## 2020-10-29 DIAGNOSIS — O26893 Other specified pregnancy related conditions, third trimester: Secondary | ICD-10-CM | POA: Diagnosis not present

## 2020-10-29 DIAGNOSIS — Z3A39 39 weeks gestation of pregnancy: Secondary | ICD-10-CM | POA: Diagnosis not present

## 2020-10-29 DIAGNOSIS — Z6741 Type O blood, Rh negative: Secondary | ICD-10-CM | POA: Diagnosis not present

## 2020-10-29 DIAGNOSIS — O9832 Other infections with a predominantly sexual mode of transmission complicating childbirth: Secondary | ICD-10-CM | POA: Diagnosis not present

## 2020-10-29 DIAGNOSIS — O99344 Other mental disorders complicating childbirth: Secondary | ICD-10-CM | POA: Diagnosis not present

## 2020-10-29 DIAGNOSIS — Z79899 Other long term (current) drug therapy: Secondary | ICD-10-CM | POA: Diagnosis not present

## 2020-11-15 DIAGNOSIS — F419 Anxiety disorder, unspecified: Secondary | ICD-10-CM | POA: Diagnosis not present

## 2020-12-04 DIAGNOSIS — R109 Unspecified abdominal pain: Secondary | ICD-10-CM | POA: Diagnosis not present

## 2020-12-11 ENCOUNTER — Other Ambulatory Visit: Payer: Self-pay

## 2020-12-11 ENCOUNTER — Encounter: Payer: Self-pay | Admitting: Internal Medicine

## 2020-12-11 ENCOUNTER — Ambulatory Visit: Payer: BC Managed Care – PPO | Admitting: Internal Medicine

## 2020-12-11 VITALS — BP 102/56 | HR 70 | Temp 97.7°F | Resp 17 | Ht 66.0 in | Wt 182.8 lb

## 2020-12-11 DIAGNOSIS — F902 Attention-deficit hyperactivity disorder, combined type: Secondary | ICD-10-CM | POA: Diagnosis not present

## 2020-12-11 DIAGNOSIS — Z6829 Body mass index (BMI) 29.0-29.9, adult: Secondary | ICD-10-CM

## 2020-12-11 DIAGNOSIS — F411 Generalized anxiety disorder: Secondary | ICD-10-CM | POA: Diagnosis not present

## 2020-12-11 DIAGNOSIS — Z6828 Body mass index (BMI) 28.0-28.9, adult: Secondary | ICD-10-CM | POA: Insufficient documentation

## 2020-12-11 DIAGNOSIS — Z6826 Body mass index (BMI) 26.0-26.9, adult: Secondary | ICD-10-CM | POA: Insufficient documentation

## 2020-12-11 DIAGNOSIS — E663 Overweight: Secondary | ICD-10-CM | POA: Insufficient documentation

## 2020-12-11 MED ORDER — LORAZEPAM 0.5 MG PO TABS: 0.5000 mg | ORAL_TABLET | Freq: Every day | ORAL | 0 refills | Status: DC | PRN

## 2020-12-11 NOTE — Assessment & Plan Note (Signed)
Discussed trying Vyvanse as she is no longer breastfeeding She will consider this and let me know

## 2020-12-11 NOTE — Assessment & Plan Note (Signed)
She is only 6 weeks postpartum, would like to lose another 20 lbs

## 2020-12-11 NOTE — Progress Notes (Signed)
Subjective:    Patient ID: Laura Dodson, female    DOB: 10-04-1980, 40 y.o.   MRN: [id number]  HPI  Pt presents to the clinic today for follow up of chronic conditions. She is establishing care with me today, transferring care from Danielle Rankin, NP.  ADHD: She reports she is forgetful, has difficulty finishing projects/task until the last minute. She was on Strattera in the past but stopped when she found out she was pregnant. She did not tolerate Adderall prior. She is not sure if she wants to get started back on anything at this time.  Anxiety and Panic Attacks: Intermittent, currently an issue since having a newborn that is having GI issues. She takes Lorazepam as needed with good results. She had her OB send in a RX for this but did not realize it was Xanax instead of Lorazepam. She reports Xanax makes her feel like a zombie.  She is not currently seeing a therapist. She denies depression, SI/HI  Review of Systems     Past Medical History:  Diagnosis Date   Anxiety     Current Outpatient Medications  Medication Sig Dispense Refill   LORazepam (ATIVAN) 0.5 MG tablet Take 1 tablet (0.5 mg total) by mouth at bedtime as needed for anxiety. 30 tablet 0   Ascorbic Acid (VITAMIN C) 100 MG tablet Take 100 mg by mouth daily.     cholecalciferol (VITAMIN D3) 25 MCG (1000 UNIT) tablet Take 1,000 Units by mouth daily.     ELDERBERRY PO Take by mouth.     Prenatal Vit-Fe Fumarate-FA (PRENATAL MULTIVITAMIN) TABS tablet Take 1 tablet by mouth daily at 12 noon.     valACYclovir (VALTREX) 500 MG tablet Take 1 tablet (500 mg total) by mouth 2 (two) times daily as needed. 30 tablet 1   Zinc 100 MG TABS Take by mouth.     No current facility-administered medications for this visit.    No Known Allergies  Family History  Problem Relation Age of Onset   Healthy Mother    Squamous cell carcinoma Mother    Atrial fibrillation Father    COPD Father    Heart failure Father    Healthy Brother     Healthy Daughter    Squamous cell carcinoma Maternal Aunt    Melanoma Maternal Grandmother    Lung cancer Maternal Grandmother    Cancer Paternal Grandfather    Healthy Brother     Social History   Socioeconomic History   Marital status: Married    Spouse name: Not on file   Number of children: Not on file   Years of education: Not on file   Highest education level: Associate degree: occupational, Scientist, product/process development, or vocational program  Occupational History   Not on file  Tobacco Use   Smoking status: Former    Packs/day: 0.50    Years: 15.00    Pack years: 7.50    Types: Cigarettes    Quit date: 2012    Years since quitting: 10.8   Smokeless tobacco: Never  Vaping Use   Vaping Use: Never used  Substance and Sexual Activity   Alcohol use: Not Currently    Comment: 2-3   Drug use: No   Sexual activity: Not on file  Other Topics Concern   Not on file  Social History Narrative   Not on file   Social Determinants of Health   Financial Resource Strain: Not on file  Food Insecurity: Not on file  Transportation Needs:  Not on file  Physical Activity: Not on file  Stress: Not on file  Social Connections: Not on file  Intimate Partner Violence: Not on file     Constitutional: Denies fever, malaise, fatigue, headache or abrupt weight changes.  Respiratory: Denies difficulty breathing, shortness of breath, cough or sputum production.   Cardiovascular: Denies chest pain, chest tightness, palpitations or swelling in the hands or feet.  Musculoskeletal: Denies decrease in range of motion, difficulty with gait, muscle pain or joint pain and swelling.  Skin: Denies redness, rashes, lesions or ulcercations.  Neurological: Pt reports inattention, difficulty finishing tasks. Denies dizziness, difficulty with memory, difficulty with speech or problems with balance and coordination.  Psych: Pt has a history of anxiety. Denies depression, SI/HI.  No other specific complaints in a  complete review of systems (except as listed in HPI above).  Objective:   Physical Exam  BP (!) 102/56 (BP Location: Right Arm, Patient Position: Sitting, Cuff Size: Normal)   Pulse 70   Temp 97.7 F (36.5 C) (Temporal)   Resp 17   Ht 5\' 6"  (1.676 m)   Wt 182 lb 12.8 oz (82.9 kg)   SpO2 100%   BMI 29.50 kg/m   Wt Readings from Last 3 Encounters:  11/23/19 172 lb 12.8 oz (78.4 kg)  10/21/19 171 lb 9.6 oz (77.8 kg)  09/16/19 172 lb 9.6 oz (78.3 kg)    General: Appears her stated age, overweight, in NAD. Skin: Warm, dry and intact.  Cardiovascular: Normal rate. Pulmonary/Chest: Normal effort. Musculoskeletal: No difficulty with gait.  Neurological: Alert and oriented.  Psychiatric: Mood and affect normal. Mildly anxious appearing. Judgment and thought content normal.     BMET    Component Value Date/Time   NA 138 09/16/2019 1028   K 4.4 09/16/2019 1028   CL 104 09/16/2019 1028   CO2 28 09/16/2019 1028   GLUCOSE 86 09/16/2019 1028   BUN 11 09/16/2019 1028   CREATININE 0.69 09/16/2019 1028   CALCIUM 8.8 09/16/2019 1028   GFRNONAA 109 09/16/2019 1028   GFRAA 126 09/16/2019 1028    Lipid Panel     Component Value Date/Time   CHOL 179 09/16/2019 1028   TRIG 78 09/16/2019 1028   HDL 69 09/16/2019 1028   CHOLHDL 2.6 09/16/2019 1028   LDLCALC 93 09/16/2019 1028    CBC    Component Value Date/Time   WBC 4.6 09/16/2019 1028   RBC 4.19 09/16/2019 1028   HGB 13.6 09/16/2019 1028   HCT 41.8 09/16/2019 1028   PLT 185 09/16/2019 1028   MCV 99.8 09/16/2019 1028   MCH 32.5 09/16/2019 1028   MCHC 32.5 09/16/2019 1028   RDW 12.0 09/16/2019 1028   LYMPHSABS 1,780 09/16/2019 1028   EOSABS 60 09/16/2019 1028   BASOSABS 18 09/16/2019 1028    Hgb A1C Lab Results  Component Value Date   HGBA1C 5.0 03/10/2018           Assessment & Plan:     03/12/2018, NP This visit occurred during the SARS-CoV-2 public health emergency.  Safety protocols were in place,  including screening questions prior to the visit, additional usage of staff PPE, and extensive cleaning of exam room while observing appropriate contact time as indicated for disinfecting solutions.

## 2020-12-11 NOTE — Patient Instructions (Signed)

## 2020-12-11 NOTE — Assessment & Plan Note (Signed)
PDMP reviewed RX for Lorazepam 0.5 mg daily prn- she is aware of addiction caution Support offered

## 2020-12-17 ENCOUNTER — Other Ambulatory Visit: Payer: Self-pay

## 2020-12-17 MED ORDER — VALACYCLOVIR HCL 500 MG PO TABS
500.0000 mg | ORAL_TABLET | Freq: Two times a day (BID) | ORAL | 1 refills | Status: DC | PRN
Start: 2020-12-17 — End: 2021-05-14

## 2020-12-19 ENCOUNTER — Other Ambulatory Visit: Payer: Self-pay

## 2020-12-28 ENCOUNTER — Other Ambulatory Visit: Payer: Self-pay | Admitting: Internal Medicine

## 2020-12-29 NOTE — Telephone Encounter (Signed)
Patient called, no answer, left VM to call the pharmacy to check on the refill. MyChart message also sent to patient. Will refuse this request, already sent on 12/17/20 #30/1 refill.  Requested Prescriptions  Pending Prescriptions Disp Refills   valACYclovir (VALTREX) 500 MG tablet [Pharmacy Med Name: VALACYCLOVIR HCL 500 MG TABLET] 30 tablet 1    Sig: TAKE 1 TABLET BY MOUTH TWICE A DAY AS NEEDED     Antimicrobials:  Antiviral Agents - Anti-Herpetic Passed - 12/28/2020 11:35 AM      Passed - Valid encounter within last 12 months    Recent Outpatient Visits           2 weeks ago GAD (generalized anxiety disorder)   [facility] [place], Salvadore Oxford, NP   9 months ago Attention deficit hyperactivity disorder (ADHD), unspecified ADHD type   [facility], Jodelle Gross, FNP   1 year ago Brain fog   [facility], Jodelle Gross, FNP   1 year ago Cough   [facility], Jodelle Gross, FNP   1 year ago Severe anxiety with panic   [facility], Jodelle Gross, FNP

## 2021-01-15 ENCOUNTER — Encounter: Payer: Self-pay | Admitting: Internal Medicine

## 2021-01-16 MED ORDER — ATOMOXETINE HCL 25 MG PO CAPS: 25.0000 mg | ORAL_CAPSULE | Freq: Every day | ORAL | 0 refills | Status: DC

## 2021-02-12 ENCOUNTER — Other Ambulatory Visit: Payer: Self-pay | Admitting: Internal Medicine

## 2021-02-27 ENCOUNTER — Other Ambulatory Visit: Payer: Self-pay | Admitting: Internal Medicine

## 2021-02-27 NOTE — Telephone Encounter (Signed)
Requested medication (s) are due for refill today:   Provider to review  Requested medication (s) are on the active medication list:   Yes  Future visit scheduled:   No   Last ordered: 02/13/2021 #30, 0 refill  Non delegated refill   Requested Prescriptions  Pending Prescriptions Disp Refills   atomoxetine (STRATTERA) 25 MG capsule [Pharmacy Med Name: ATOMOXETINE HCL 25 MG CAPSULE] 90 capsule 1    Sig: TAKE 1 CAPSULE BY MOUTH EVERY DAY     Not Delegated - Psychiatry: Norepinephrine Reuptake Inhibitor Failed - 02/27/2021 10:27 AM      Failed - This refill cannot be delegated      Passed - Valid encounter within last 6 months    Recent Outpatient Visits           2 months ago GAD (generalized anxiety disorder)   [facility] [place], Salvadore Oxford, NP   11 months ago Attention deficit hyperactivity disorder (ADHD), unspecified ADHD type   [facility], Jodelle Gross, FNP   1 year ago Brain fog   [facility], Jodelle Gross, FNP   1 year ago Cough   [facility], Jodelle Gross, FNP   1 year ago Severe anxiety with panic   [facility], Jodelle Gross, FNP

## 2021-03-11 ENCOUNTER — Other Ambulatory Visit: Payer: Self-pay | Admitting: Internal Medicine

## 2021-03-12 MED ORDER — LORAZEPAM 0.5 MG PO TABS: 0.5000 mg | ORAL_TABLET | Freq: Every day | ORAL | 0 refills | Status: DC | PRN

## 2021-03-12 NOTE — Telephone Encounter (Signed)
Please review. Last office visit 12/11/2020.  KP

## 2021-04-11 DIAGNOSIS — E348 Other specified endocrine disorders: Secondary | ICD-10-CM | POA: Diagnosis not present

## 2021-04-11 DIAGNOSIS — R5383 Other fatigue: Secondary | ICD-10-CM | POA: Diagnosis not present

## 2021-04-11 DIAGNOSIS — E569 Vitamin deficiency, unspecified: Secondary | ICD-10-CM | POA: Diagnosis not present

## 2021-04-11 DIAGNOSIS — E063 Autoimmune thyroiditis: Secondary | ICD-10-CM | POA: Diagnosis not present

## 2021-04-11 DIAGNOSIS — E7211 Homocystinuria: Secondary | ICD-10-CM | POA: Diagnosis not present

## 2021-04-15 DIAGNOSIS — Z20828 Contact with and (suspected) exposure to other viral communicable diseases: Secondary | ICD-10-CM | POA: Diagnosis not present

## 2021-04-15 DIAGNOSIS — R059 Cough, unspecified: Secondary | ICD-10-CM | POA: Diagnosis not present

## 2021-04-19 DIAGNOSIS — E348 Other specified endocrine disorders: Secondary | ICD-10-CM | POA: Diagnosis not present

## 2021-04-19 DIAGNOSIS — E569 Vitamin deficiency, unspecified: Secondary | ICD-10-CM | POA: Diagnosis not present

## 2021-04-19 DIAGNOSIS — R5383 Other fatigue: Secondary | ICD-10-CM | POA: Diagnosis not present

## 2021-04-19 DIAGNOSIS — R7303 Prediabetes: Secondary | ICD-10-CM | POA: Diagnosis not present

## 2021-04-19 DIAGNOSIS — E7211 Homocystinuria: Secondary | ICD-10-CM | POA: Diagnosis not present

## 2021-05-01 DIAGNOSIS — E279 Disorder of adrenal gland, unspecified: Secondary | ICD-10-CM | POA: Diagnosis not present

## 2021-05-01 DIAGNOSIS — D510 Vitamin B12 deficiency anemia due to intrinsic factor deficiency: Secondary | ICD-10-CM | POA: Diagnosis not present

## 2021-05-01 DIAGNOSIS — E559 Vitamin D deficiency, unspecified: Secondary | ICD-10-CM | POA: Diagnosis not present

## 2021-05-01 DIAGNOSIS — E630 Essential fatty acid [EFA] deficiency: Secondary | ICD-10-CM | POA: Diagnosis not present

## 2021-05-01 DIAGNOSIS — R5383 Other fatigue: Secondary | ICD-10-CM | POA: Diagnosis not present

## 2021-05-11 ENCOUNTER — Other Ambulatory Visit: Payer: Self-pay | Admitting: Internal Medicine

## 2021-05-14 NOTE — Telephone Encounter (Signed)
Requested Prescriptions  ?Pending Prescriptions Disp Refills  ?? valACYclovir (VALTREX) 500 MG tablet [Pharmacy Med Name: VALACYCLOVIR HCL 500 MG TABLET] 30 tablet 1  ?  Sig: TAKE 1 TABLET BY MOUTH TWICE A DAY AS NEEDED  ?  ? Antimicrobials:  Antiviral Agents - Anti-Herpetic Passed - 05/11/2021  2:35 PM  ?  ?  Passed - Valid encounter within last 12 months  ?  Recent Outpatient Visits   ?      ? 5 months ago GAD (generalized anxiety disorder)  ? [facility] [place], Coralie Keens, NP  ? 1 year ago Attention deficit hyperactivity disorder (ADHD), unspecified ADHD type  ? [place], FNP  ? 1 year ago Brain fog  ? [place], FNP  ? 1 year ago Cough  ? [place], FNP  ? 1 year ago Severe anxiety with panic  ? [facility], Lupita Raider, FNP  ?  ?  ? ?  ?  ?  ? ?

## 2021-05-28 ENCOUNTER — Other Ambulatory Visit: Payer: Self-pay | Admitting: Internal Medicine

## 2021-05-29 NOTE — Telephone Encounter (Signed)
Requested medication (s) are due for refill today: yes ? ?Requested medication (s) are on the active medication list: yes ? ?Last refill:  03/12/21 #30 with 0 RF ? ?Future visit scheduled: no, was asked at Oct appt to return in one year ? ?Notes to clinic:  This medication can not be delegated, please assess.  ? ? ? ?  ? ?Requested Prescriptions  ?Pending Prescriptions Disp Refills  ? LORazepam (ATIVAN) 0.5 MG tablet [Pharmacy Med Name: LORAZEPAM 0.5 MG TABLET] 30 tablet 0  ?  Sig: TAKE 1 TABLET BY MOUTH EVERY DAY AS NEEDED FOR ANXIETY  ?  ? Not Delegated - Psychiatry: Anxiolytics/Hypnotics 2 Failed - 05/28/2021 12:27 PM  ?  ?  Failed - This refill cannot be delegated  ?  ?  Failed - Urine Drug Screen completed in last 360 days  ?  ?  Passed - Patient is not pregnant  ?  ?  Passed - Valid encounter within last 6 months  ?  Recent Outpatient Visits   ? ?      ? 5 months ago GAD (generalized anxiety disorder)  ? [facility], Coralie Keens, NP  ? 1 year ago Attention deficit hyperactivity disorder (ADHD), unspecified ADHD type  ? [place], FNP  ? 1 year ago Brain fog  ? [place], FNP  ? 1 year ago Cough  ? [place], FNP  ? 1 year ago Severe anxiety with panic  ? [facility], Lupita Raider, FNP  ? ?  ?  ? ?  ?  ?  ? ? ?

## 2021-05-30 ENCOUNTER — Other Ambulatory Visit: Payer: Self-pay | Admitting: Internal Medicine

## 2021-05-30 NOTE — Telephone Encounter (Signed)
Requested Prescriptions  ?Pending Prescriptions Disp Refills  ?? valACYclovir (VALTREX) 500 MG tablet [Pharmacy Med Name: VALACYCLOVIR HCL 500 MG TABLET] 30 tablet 1  ?  Sig: TAKE 1 TABLET BY MOUTH TWICE A DAY AS NEEDED  ?  ? Antimicrobials:  Antiviral Agents - Anti-Herpetic Passed - 05/30/2021  8:32 AM  ?  ?  Passed - Valid encounter within last 12 months  ?  Recent Outpatient Visits   ?      ? 5 months ago GAD (generalized anxiety disorder)  ? [facility] [place], Coralie Keens, NP  ? 1 year ago Attention deficit hyperactivity disorder (ADHD), unspecified ADHD type  ? [place], FNP  ? 1 year ago Brain fog  ? [place], FNP  ? 1 year ago Cough  ? [place], FNP  ? 1 year ago Severe anxiety with panic  ? [facility], Lupita Raider, FNP  ?  ?  ? ?  ?  ?  ? ? ?

## 2021-06-06 DIAGNOSIS — R5383 Other fatigue: Secondary | ICD-10-CM | POA: Diagnosis not present

## 2021-06-12 DIAGNOSIS — F411 Generalized anxiety disorder: Secondary | ICD-10-CM | POA: Diagnosis not present

## 2021-06-12 DIAGNOSIS — F909 Attention-deficit hyperactivity disorder, unspecified type: Secondary | ICD-10-CM | POA: Diagnosis not present

## 2021-06-12 DIAGNOSIS — E538 Deficiency of other specified B group vitamins: Secondary | ICD-10-CM | POA: Diagnosis not present

## 2021-06-12 DIAGNOSIS — E279 Disorder of adrenal gland, unspecified: Secondary | ICD-10-CM | POA: Diagnosis not present

## 2021-06-13 DIAGNOSIS — F411 Generalized anxiety disorder: Secondary | ICD-10-CM | POA: Diagnosis not present

## 2021-06-13 DIAGNOSIS — F902 Attention-deficit hyperactivity disorder, combined type: Secondary | ICD-10-CM | POA: Diagnosis not present

## 2021-07-29 ENCOUNTER — Other Ambulatory Visit: Payer: Self-pay | Admitting: Internal Medicine

## 2021-07-29 NOTE — Telephone Encounter (Signed)
Requested medication (s) are due for refill today: Yes  Requested medication (s) are on the active medication list: Yes  Last refill:  05/29/21  Future visit scheduled: No  Notes to clinic:  Not delegated.    Requested Prescriptions  Pending Prescriptions Disp Refills   LORazepam (ATIVAN) 0.5 MG tablet [Pharmacy Med Name: LORAZEPAM 0.5 MG TABLET] 30 tablet 0    Sig: TAKE 1 TABLET BY MOUTH EVERY DAY AS NEEDED FOR ANXIETY     Not Delegated - Psychiatry: Anxiolytics/Hypnotics 2 Failed - 07/29/2021  6:39 AM      Failed - This refill cannot be delegated      Failed - Urine Drug Screen completed in last 360 days      Failed - Valid encounter within last 6 months    Recent Outpatient Visits           7 months ago GAD (generalized anxiety disorder)   [facility] [place], Salvadore Oxford, NP   1 year ago Attention deficit hyperactivity disorder (ADHD), unspecified ADHD type   [facility], Jodelle Gross, FNP   1 year ago Brain fog   [facility], Jodelle Gross, FNP   1 year ago Cough   [facility], Jodelle Gross, FNP   1 year ago Severe anxiety with panic   [facility], Jodelle Gross, [place]              Passed - Patient is not pregnant

## 2021-07-30 DIAGNOSIS — F411 Generalized anxiety disorder: Secondary | ICD-10-CM | POA: Diagnosis not present

## 2021-08-13 DIAGNOSIS — F411 Generalized anxiety disorder: Secondary | ICD-10-CM | POA: Diagnosis not present

## 2021-08-22 DIAGNOSIS — E559 Vitamin D deficiency, unspecified: Secondary | ICD-10-CM | POA: Diagnosis not present

## 2021-08-22 DIAGNOSIS — E279 Disorder of adrenal gland, unspecified: Secondary | ICD-10-CM | POA: Diagnosis not present

## 2021-08-27 DIAGNOSIS — F902 Attention-deficit hyperactivity disorder, combined type: Secondary | ICD-10-CM | POA: Diagnosis not present

## 2021-08-30 ENCOUNTER — Other Ambulatory Visit: Payer: Self-pay | Admitting: Internal Medicine

## 2021-08-30 NOTE — Telephone Encounter (Signed)
Requested Prescriptions  Pending Prescriptions Disp Refills  . valACYclovir (VALTREX) 500 MG tablet [Pharmacy Med Name: VALACYCLOVIR HCL 500 MG TABLET] 180 tablet 0    Sig: TAKE 1 TABLET BY MOUTH TWICE A DAY AS NEEDED     Antimicrobials:  Antiviral Agents - Anti-Herpetic Passed - 08/30/2021  2:15 AM      Passed - Valid encounter within last 12 months    Recent Outpatient Visits          8 months ago GAD (generalized anxiety disorder)   [facility] [place], Salvadore Oxford, NP   1 year ago Attention deficit hyperactivity disorder (ADHD), unspecified ADHD type   [facility], Jodelle Gross, FNP   1 year ago Brain fog   [facility], Jodelle Gross, FNP   1 year ago Cough   [facility], Jodelle Gross, FNP   1 year ago Severe anxiety with panic   [facility], Jodelle Gross, FNP

## 2021-09-02 DIAGNOSIS — M722 Plantar fascial fibromatosis: Secondary | ICD-10-CM | POA: Diagnosis not present

## 2021-09-02 DIAGNOSIS — M9901 Segmental and somatic dysfunction of cervical region: Secondary | ICD-10-CM | POA: Diagnosis not present

## 2021-09-02 DIAGNOSIS — G540 Brachial plexus disorders: Secondary | ICD-10-CM | POA: Diagnosis not present

## 2021-09-02 DIAGNOSIS — M542 Cervicalgia: Secondary | ICD-10-CM | POA: Diagnosis not present

## 2021-09-03 DIAGNOSIS — F411 Generalized anxiety disorder: Secondary | ICD-10-CM | POA: Diagnosis not present

## 2021-09-06 DIAGNOSIS — M542 Cervicalgia: Secondary | ICD-10-CM | POA: Diagnosis not present

## 2021-09-06 DIAGNOSIS — M9901 Segmental and somatic dysfunction of cervical region: Secondary | ICD-10-CM | POA: Diagnosis not present

## 2021-09-06 DIAGNOSIS — G540 Brachial plexus disorders: Secondary | ICD-10-CM | POA: Diagnosis not present

## 2021-09-06 DIAGNOSIS — R140 Abdominal distension (gaseous): Secondary | ICD-10-CM | POA: Diagnosis not present

## 2021-09-06 DIAGNOSIS — M722 Plantar fascial fibromatosis: Secondary | ICD-10-CM | POA: Diagnosis not present

## 2021-09-09 ENCOUNTER — Other Ambulatory Visit: Payer: Self-pay | Admitting: Internal Medicine

## 2021-09-09 DIAGNOSIS — M9901 Segmental and somatic dysfunction of cervical region: Secondary | ICD-10-CM | POA: Diagnosis not present

## 2021-09-09 DIAGNOSIS — M722 Plantar fascial fibromatosis: Secondary | ICD-10-CM | POA: Diagnosis not present

## 2021-09-09 DIAGNOSIS — M542 Cervicalgia: Secondary | ICD-10-CM | POA: Diagnosis not present

## 2021-09-09 DIAGNOSIS — G540 Brachial plexus disorders: Secondary | ICD-10-CM | POA: Diagnosis not present

## 2021-09-10 ENCOUNTER — Telehealth: Payer: Self-pay | Admitting: Internal Medicine

## 2021-09-10 MED ORDER — LORAZEPAM 0.5 MG PO TABS: 0.5000 mg | ORAL_TABLET | Freq: Every day | ORAL | 0 refills | Status: DC | PRN

## 2021-09-10 NOTE — Telephone Encounter (Signed)
Medication Refill - Medication: LORazepam (ATIVAN) 0.5 MG tablet  Has the patient contacted their pharmacy? Yes.   Pt stated the pharmacy doesn't have it in stock; please transfer it to the pharmacy below.   (Agent: If yes, when and what did the pharmacy advise?)  Preferred Pharmacy (with phone number or street name):  [facility] [place], [place] - [address]  [address] [place] [address]  Phone: [phone] Fax: [phone]  Hours: Not open 24 hours   Has the patient been seen for an appointment in the last year OR does the patient have an upcoming appointment? No.  Agent: Please be advised that RX refills may take up to 3 business days. We ask that you follow-up with your pharmacy.

## 2021-09-10 NOTE — Addendum Note (Signed)
Addended by: Lorre Munroe on: 09/10/2021 10:17 AM   Modules accepted: Orders

## 2021-09-13 DIAGNOSIS — M542 Cervicalgia: Secondary | ICD-10-CM | POA: Diagnosis not present

## 2021-09-13 DIAGNOSIS — G540 Brachial plexus disorders: Secondary | ICD-10-CM | POA: Diagnosis not present

## 2021-09-13 DIAGNOSIS — M9901 Segmental and somatic dysfunction of cervical region: Secondary | ICD-10-CM | POA: Diagnosis not present

## 2021-09-13 DIAGNOSIS — M722 Plantar fascial fibromatosis: Secondary | ICD-10-CM | POA: Diagnosis not present

## 2021-09-17 DIAGNOSIS — Z01419 Encounter for gynecological examination (general) (routine) without abnormal findings: Secondary | ICD-10-CM | POA: Diagnosis not present

## 2021-09-17 DIAGNOSIS — Z1231 Encounter for screening mammogram for malignant neoplasm of breast: Secondary | ICD-10-CM | POA: Diagnosis not present

## 2021-09-17 DIAGNOSIS — N393 Stress incontinence (female) (male): Secondary | ICD-10-CM | POA: Diagnosis not present

## 2021-09-17 DIAGNOSIS — Z124 Encounter for screening for malignant neoplasm of cervix: Secondary | ICD-10-CM | POA: Diagnosis not present

## 2021-09-17 LAB — RESULTS CONSOLE HPV: CHL HPV: NEGATIVE

## 2021-09-17 LAB — HM PAP SMEAR

## 2021-09-18 DIAGNOSIS — G540 Brachial plexus disorders: Secondary | ICD-10-CM | POA: Diagnosis not present

## 2021-09-18 DIAGNOSIS — M542 Cervicalgia: Secondary | ICD-10-CM | POA: Diagnosis not present

## 2021-09-18 DIAGNOSIS — M9901 Segmental and somatic dysfunction of cervical region: Secondary | ICD-10-CM | POA: Diagnosis not present

## 2021-09-18 DIAGNOSIS — M722 Plantar fascial fibromatosis: Secondary | ICD-10-CM | POA: Diagnosis not present

## 2021-09-24 DIAGNOSIS — R140 Abdominal distension (gaseous): Secondary | ICD-10-CM | POA: Diagnosis not present

## 2021-10-07 DIAGNOSIS — F411 Generalized anxiety disorder: Secondary | ICD-10-CM | POA: Diagnosis not present

## 2021-10-16 DIAGNOSIS — N8110 Cystocele, unspecified: Secondary | ICD-10-CM | POA: Diagnosis not present

## 2021-10-16 DIAGNOSIS — F902 Attention-deficit hyperactivity disorder, combined type: Secondary | ICD-10-CM | POA: Diagnosis not present

## 2021-10-19 ENCOUNTER — Other Ambulatory Visit: Payer: Self-pay | Admitting: Internal Medicine

## 2021-10-22 MED ORDER — LORAZEPAM 0.5 MG PO TABS: 0.5000 mg | ORAL_TABLET | Freq: Every day | ORAL | 0 refills | Status: DC | PRN

## 2021-11-07 DIAGNOSIS — N3946 Mixed incontinence: Secondary | ICD-10-CM | POA: Diagnosis not present

## 2021-11-07 DIAGNOSIS — N814 Uterovaginal prolapse, unspecified: Secondary | ICD-10-CM | POA: Diagnosis not present

## 2021-11-26 ENCOUNTER — Telehealth: Payer: Self-pay

## 2021-11-26 DIAGNOSIS — E538 Deficiency of other specified B group vitamins: Secondary | ICD-10-CM | POA: Diagnosis not present

## 2021-11-26 DIAGNOSIS — F902 Attention-deficit hyperactivity disorder, combined type: Secondary | ICD-10-CM | POA: Diagnosis not present

## 2021-11-26 DIAGNOSIS — F411 Generalized anxiety disorder: Secondary | ICD-10-CM | POA: Diagnosis not present

## 2021-11-26 NOTE — Telephone Encounter (Signed)
received referral from intergative medical clinic - pending

## 2021-12-04 DIAGNOSIS — L821 Other seborrheic keratosis: Secondary | ICD-10-CM | POA: Diagnosis not present

## 2021-12-04 DIAGNOSIS — L815 Leukoderma, not elsewhere classified: Secondary | ICD-10-CM | POA: Diagnosis not present

## 2021-12-04 DIAGNOSIS — L578 Other skin changes due to chronic exposure to nonionizing radiation: Secondary | ICD-10-CM | POA: Diagnosis not present

## 2021-12-04 DIAGNOSIS — L570 Actinic keratosis: Secondary | ICD-10-CM | POA: Diagnosis not present

## 2021-12-04 DIAGNOSIS — D485 Neoplasm of uncertain behavior of skin: Secondary | ICD-10-CM | POA: Diagnosis not present

## 2021-12-12 ENCOUNTER — Encounter: Payer: Self-pay | Admitting: Internal Medicine

## 2021-12-12 ENCOUNTER — Ambulatory Visit: Payer: BC Managed Care – PPO | Admitting: Internal Medicine

## 2021-12-12 VITALS — BP 114/68 | HR 74 | Temp 97.1°F | Wt 165.0 lb

## 2021-12-12 DIAGNOSIS — R051 Acute cough: Secondary | ICD-10-CM

## 2021-12-12 DIAGNOSIS — Z6826 Body mass index (BMI) 26.0-26.9, adult: Secondary | ICD-10-CM

## 2021-12-12 DIAGNOSIS — E663 Overweight: Secondary | ICD-10-CM

## 2021-12-12 DIAGNOSIS — Z0001 Encounter for general adult medical examination with abnormal findings: Secondary | ICD-10-CM

## 2021-12-12 DIAGNOSIS — R7309 Other abnormal glucose: Secondary | ICD-10-CM | POA: Diagnosis not present

## 2021-12-12 DIAGNOSIS — Z79899 Other long term (current) drug therapy: Secondary | ICD-10-CM | POA: Diagnosis not present

## 2021-12-12 MED ORDER — LORAZEPAM 0.5 MG PO TABS: 0.5000 mg | ORAL_TABLET | Freq: Every day | ORAL | 0 refills | Status: DC | PRN

## 2021-12-12 MED ORDER — AZITHROMYCIN 250 MG PO TABS: ORAL_TABLET | ORAL | 0 refills | Status: DC

## 2021-12-12 NOTE — Assessment & Plan Note (Signed)
Encourage diet and exercise for weight loss 

## 2021-12-12 NOTE — Progress Notes (Signed)
Subjective:    Patient ID: Laura Dodson, female    DOB: [birth year], 41 y.o.   MRN: [id number]  HPI  Patient presents to clinic today for her annual exam.  Flu: never Tetanus: 05/2014 COVID: never Pap smear: 09/2021 Mammogram: 10/2018 Vision screening: as needed Dentist: biannually  Diet: She does eat meat. She consumes fruits and veggies. She tries to avoid fried foods. She drinks mostly water. Exercise: Walking, gym 4-5 days per week  Review of Systems     Past Medical History:  Diagnosis Date   Anxiety     Current Outpatient Medications  Medication Sig Dispense Refill   Ascorbic Acid (VITAMIN C) 100 MG tablet Take 100 mg by mouth daily.     atomoxetine (STRATTERA) 25 MG capsule TAKE 1 CAPSULE BY MOUTH EVERY DAY 90 capsule 1   cholecalciferol (VITAMIN D3) 25 MCG (1000 UNIT) tablet Take 1,000 Units by mouth daily.     ELDERBERRY PO Take by mouth.     LORazepam (ATIVAN) 0.5 MG tablet Take 1 tablet (0.5 mg total) by mouth daily as needed for anxiety. 30 tablet 0   Prenatal Vit-Fe Fumarate-FA (PRENATAL MULTIVITAMIN) TABS tablet Take 1 tablet by mouth daily at 12 noon.     valACYclovir (VALTREX) 500 MG tablet TAKE 1 TABLET BY MOUTH TWICE A DAY AS NEEDED 180 tablet 0   Zinc 100 MG TABS Take by mouth.     No current facility-administered medications for this visit.    No Known Allergies  Family History  Problem Relation Age of Onset   Healthy Mother    Squamous cell carcinoma Mother    Atrial fibrillation Father    COPD Father    Heart failure Father    Healthy Brother    Healthy Daughter    Squamous cell carcinoma Maternal Aunt    Melanoma Maternal Grandmother    Lung cancer Maternal Grandmother    Cancer Paternal Grandfather    Healthy Brother     Social History   Socioeconomic History   Marital status: Married    Spouse name: Not on file   Number of children: Not on file   Years of education: Not on file   Highest education level: Associate degree:  occupational, Hotel manager, or vocational program  Occupational History   Not on file  Tobacco Use   Smoking status: Former    Packs/day: 0.50    Years: 15.00    Total pack years: 7.50    Types: Cigarettes    Quit date: 2012    Years since quitting: 11.8   Smokeless tobacco: Never  Vaping Use   Vaping Use: Never used  Substance and Sexual Activity   Alcohol use: Yes    Comment: 2-3   Drug use: No   Sexual activity: Not on file  Other Topics Concern   Not on file  Social History Narrative   Not on file   Social Determinants of Health   Financial Resource Strain: Not on file  Food Insecurity: Not on file  Transportation Needs: Not on file  Physical Activity: Not on file  Stress: No Stress Concern Present (05/25/2017)   [place] of Stress : Not at all  Social Connections: Not on file  Intimate Partner Violence: Not At Risk (05/25/2017)   Humiliation, Afraid, Rape, and Kick questionnaire    Fear of Current or Ex-Partner: No    Emotionally Abused: No    Physically  Abused: No    Sexually Abused: No     Constitutional: Denies fever, malaise, fatigue, headache or abrupt weight changes.  HEENT: Denies eye pain, eye redness, ear pain, ringing in the ears, wax buildup, runny nose, nasal congestion, bloody nose, or sore throat. Respiratory: Patient reports cough.  Denies difficulty breathing, shortness of breath, or sputum production.   Cardiovascular: Denies chest pain, chest tightness, palpitations or swelling in the hands or feet.  Gastrointestinal: Denies abdominal pain, bloating, constipation, diarrhea or blood in the stool.  GU: Denies urgency, frequency, pain with urination, burning sensation, blood in urine, odor or discharge. Musculoskeletal: Denies decrease in range of motion, difficulty with gait, muscle pain or joint pain and swelling.  Skin: Denies redness, rashes, lesions or ulcercations.   Neurological: Patient reports inattention.  Denies dizziness, difficulty with memory, difficulty with speech or problems with balance and coordination.  Psych: Patient has a history of anxiety.  Denies depression, SI/HI.  No other specific complaints in a complete review of systems (except as listed in HPI above).  Objective:   Physical Exam   BP 114/68 (BP Location: Right Arm, Patient Position: Sitting, Cuff Size: Normal)   Pulse 74   Temp (!) 97.1 F (36.2 C) (Temporal)   Wt 165 lb (74.8 kg)   SpO2 99%   BMI 26.63 kg/m   Wt Readings from Last 3 Encounters:  12/11/20 182 lb 12.8 oz (82.9 kg)  11/23/19 172 lb 12.8 oz (78.4 kg)  10/21/19 171 lb 9.6 oz (77.8 kg)    General: Appears her stated age, overweight in NAD. Skin: Warm, dry and intact.  HEENT: Head: normal shape and size; Eyes: sclera white, no icterus, conjunctiva pink, PERRLA and EOMs intact;  Neck:  Neck supple, trachea midline. No masses, lumps or thyromegaly present.  Cardiovascular: Normal rate and rhythm. S1,S2 noted.  No murmur, rubs or gallops noted. No JVD or BLE edema. No carotid bruits noted. Pulmonary/Chest: Normal effort and positive vesicular breath sounds with intermittent expiratory wheeze noted of the LLL.Marland Kitchen No respiratory distress. No rales or ronchi noted.  Abdomen: Normal bowel sound Musculoskeletal: Strength 5/5 BUE/BLE.  No difficulty with gait.  Neurological: Alert and oriented. Cranial nerves II-XII grossly intact. Coordination normal.  Psychiatric: Mood and affect normal. Behavior is normal. Judgment and thought content normal.     BMET    Component Value Date/Time   NA 138 09/16/2019 1028   K 4.4 09/16/2019 1028   CL 104 09/16/2019 1028   CO2 28 09/16/2019 1028   GLUCOSE 86 09/16/2019 1028   BUN 11 09/16/2019 1028   CREATININE 0.69 09/16/2019 1028   CALCIUM 8.8 09/16/2019 1028   GFRNONAA 109 09/16/2019 1028   GFRAA 126 09/16/2019 1028    Lipid Panel     Component Value Date/Time    CHOL 179 09/16/2019 1028   TRIG 78 09/16/2019 1028   HDL 69 09/16/2019 1028   CHOLHDL 2.6 09/16/2019 1028   LDLCALC 93 09/16/2019 1028    CBC    Component Value Date/Time   WBC 4.6 09/16/2019 1028   RBC 4.19 09/16/2019 1028   HGB 13.6 09/16/2019 1028   HCT 41.8 09/16/2019 1028   PLT 185 09/16/2019 1028   MCV 99.8 09/16/2019 1028   MCH 32.5 09/16/2019 1028   MCHC 32.5 09/16/2019 1028   RDW 12.0 09/16/2019 1028   LYMPHSABS 1,780 09/16/2019 1028   EOSABS 60 09/16/2019 1028   BASOSABS 18 09/16/2019 1028    Hgb A1C Lab Results  Component Value Date   HGBA1C 5.0 03/10/2018           Assessment & Plan:   Preventative Health Maintenance:  She declines flu shot today Tetanus UTD Encouraged her to get her COVID-vaccine Pap smear UTD, will request copy Mammogram ordered by GYN Encourage her to consume a balanced diet and exercise regimen Advised her to see an eye doctor and dentist annually We will check CBC, c-Met, lipid, A1c today  Cough:  Likely bronchitis Given duration of symptoms, will treat with Azithromycin 250 mg x 5 days  RTC in 6 months, follow-up chronic conditions Webb Sil[place], NP

## 2021-12-13 LAB — CBC
HCT: 37.2 % (ref 35.0–45.0)
Hemoglobin: 12.6 g/dL (ref 11.7–15.5)
MCH: 32.1 pg (ref 27.0–33.0)
MCHC: 33.9 g/dL (ref 32.0–36.0)
MCV: 94.9 fL (ref 80.0–100.0)
MPV: 10.6 fL (ref 7.5–12.5)
Platelets: 216 10*3/uL (ref 140–400)
RBC: 3.92 10*6/uL (ref 3.80–5.10)
RDW: 11.9 % (ref 11.0–15.0)
WBC: 5.8 10*3/uL (ref 3.8–10.8)

## 2021-12-13 LAB — COMPLETE METABOLIC PANEL WITH GFR
AG Ratio: 1.6 (calc) (ref 1.0–2.5)
ALT: 14 U/L (ref 6–29)
AST: 19 U/L (ref 10–30)
Albumin: 4.2 g/dL (ref 3.6–5.1)
Alkaline phosphatase (APISO): 44 U/L (ref 31–125)
BUN: 14 mg/dL (ref 7–25)
CO2: 27 mmol/L (ref 20–32)
Calcium: 8.7 mg/dL (ref 8.6–10.2)
Chloride: 103 mmol/L (ref 98–110)
Creat: 0.77 mg/dL (ref 0.50–0.99)
Globulin: 2.7 g/dL (calc) (ref 1.9–3.7)
Glucose, Bld: 84 mg/dL (ref 65–99)
Potassium: 3.8 mmol/L (ref 3.5–5.3)
Sodium: 137 mmol/L (ref 135–146)
Total Bilirubin: 0.4 mg/dL (ref 0.2–1.2)
Total Protein: 6.9 g/dL (ref 6.1–8.1)
eGFR: 98 mL/min/{1.73_m2} (ref >=60)

## 2021-12-13 LAB — LIPID PANEL
Cholesterol: 164 mg/dL (ref <200)
HDL: 63 mg/dL (ref >=50)
LDL Cholesterol (Calc): 88 mg/dL (calc)
Non-HDL Cholesterol (Calc): 101 mg/dL (calc) (ref <130)
Total CHOL/HDL Ratio: 2.6 (calc) (ref <5.0)
Triglycerides: 52 mg/dL (ref <150)

## 2021-12-13 LAB — DM TEMPLATE

## 2021-12-13 LAB — DRUG MONITORING, PANEL 8 WITH CONFIRMATION, URINE
6 Acetylmorphine: NEGATIVE ng/mL (ref <10)
Alcohol Metabolites: NEGATIVE ng/mL (ref <500)
Amphetamines: NEGATIVE ng/mL (ref <500)
Benzodiazepines: NEGATIVE ng/mL (ref <100)
Buprenorphine, Urine: NEGATIVE ng/mL (ref <5)
Cocaine Metabolite: NEGATIVE ng/mL (ref <150)
Creatinine: 118.6 mg/dL (ref >=20.0)
MDMA: NEGATIVE ng/mL (ref <500)
Marijuana Metabolite: NEGATIVE ng/mL (ref <20)
Opiates: NEGATIVE ng/mL (ref <100)
Oxidant: NEGATIVE ug/mL (ref <200)
Oxycodone: NEGATIVE ng/mL (ref <100)
pH: 6.8 (ref 4.5–9.0)

## 2021-12-13 LAB — HEMOGLOBIN A1C
Hgb A1c MFr Bld: 5.3 % of total Hgb (ref <5.7)
Mean Plasma Glucose: 105 mg/dL
eAG (mmol/L): 5.8 mmol/L

## 2021-12-26 DIAGNOSIS — N393 Stress incontinence (female) (male): Secondary | ICD-10-CM | POA: Diagnosis not present

## 2021-12-26 DIAGNOSIS — R3915 Urgency of urination: Secondary | ICD-10-CM | POA: Diagnosis not present

## 2022-01-02 ENCOUNTER — Encounter: Payer: Self-pay | Admitting: Internal Medicine

## 2022-01-23 DIAGNOSIS — F411 Generalized anxiety disorder: Secondary | ICD-10-CM | POA: Diagnosis not present

## 2022-01-23 DIAGNOSIS — F902 Attention-deficit hyperactivity disorder, combined type: Secondary | ICD-10-CM | POA: Diagnosis not present

## 2022-02-23 ENCOUNTER — Other Ambulatory Visit: Payer: Self-pay | Admitting: Internal Medicine

## 2022-02-24 NOTE — Telephone Encounter (Signed)
Requested Prescriptions  Pending Prescriptions Disp Refills   valACYclovir (VALTREX) 500 MG tablet [Pharmacy Med Name: VALACYCLOVIR HCL 500 MG TABLET] 180 tablet 1    Sig: TAKE 1 TABLET BY MOUTH TWICE A DAY AS NEEDED     Antimicrobials:  Antiviral Agents - Anti-Herpetic Passed - 02/23/2022  4:28 AM      Passed - Valid encounter within last 12 months    Recent Outpatient Visits           2 months ago Encounter for general adult medical examination with abnormal findings   [facility] [place], Coralie Keens, NP   1 year ago GAD (generalized anxiety disorder)   [facility], Coralie Keens, NP   1 year ago Attention deficit hyperactivity disorder (ADHD), unspecified ADHD type   [place], FNP   2 years ago [place], FNP   2 years ago Cough   [facility], Lupita Raider, [place]

## 2022-04-08 ENCOUNTER — Other Ambulatory Visit: Payer: Self-pay | Admitting: Internal Medicine

## 2022-04-09 NOTE — Telephone Encounter (Signed)
Requested medication (s) are due for refill today - yes  Requested medication (s) are on the active medication list -yes  Future visit scheduled -no  Last refill: 12/12/21 #30  Notes to clinic: non delegated Rx  Requested Prescriptions  Pending Prescriptions Disp Refills   LORazepam (ATIVAN) 0.5 MG tablet [Pharmacy Med Name: LORAZEPAM 0.5 MG TABLET] 30 tablet     Sig: TAKE 1 TABLET BY MOUTH EVERY DAY AS NEEDED FOR ANXIETY     Not Delegated - Psychiatry: Anxiolytics/Hypnotics 2 Failed - 04/08/2022  8:36 AM      Failed - This refill cannot be delegated      Failed - Urine Drug Screen completed in last 360 days      Passed - Patient is not pregnant      Passed - Valid encounter within last 6 months    Recent Outpatient Visits           3 months ago Encounter for general adult medical examination with abnormal findings   [facility] [place], Coralie Keens, NP   1 year ago GAD (generalized anxiety disorder)   [facility] [place], Coralie Keens, NP   2 years ago Attention deficit hyperactivity disorder (ADHD), unspecified ADHD type   [place], FNP   2 years ago [place], FNP   2 years ago Cough   [facility] [place], Lupita Raider, [place]                 Requested Prescriptions  Pending Prescriptions Disp Refills   LORazepam (ATIVAN) 0.5 MG tablet [Pharmacy Med Name: LORAZEPAM 0.5 MG TABLET] 30 tablet     Sig: TAKE 1 TABLET BY MOUTH EVERY DAY AS NEEDED FOR ANXIETY     Not Delegated - Psychiatry: Anxiolytics/Hypnotics 2 Failed - 04/08/2022  8:36 AM      Failed - This refill cannot be delegated      Failed - Urine Drug Screen completed in last 360 days      Passed - Patient is not pregnant      Passed - Valid encounter within last 6 months    Recent Outpatient Visits           3 months ago Encounter  for general adult medical examination with abnormal findings   [facility] [place], Coralie Keens, NP   1 year ago GAD (generalized anxiety disorder)   [facility] [place], Coralie Keens, NP   2 years ago Attention deficit hyperactivity disorder (ADHD), unspecified ADHD type   [facility] [place], Lupita Raider, FNP   2 years ago [place], FNP   2 years ago Cough   [facility] [place], Lupita Raider, 

## 2022-05-01 ENCOUNTER — Ambulatory Visit: Payer: BC Managed Care – PPO | Admitting: Internal Medicine

## 2022-05-01 ENCOUNTER — Encounter: Payer: Self-pay | Admitting: Internal Medicine

## 2022-05-01 VITALS — BP 102/64 | HR 63 | Temp 96.8°F | Wt 176.0 lb

## 2022-05-01 DIAGNOSIS — F902 Attention-deficit hyperactivity disorder, combined type: Secondary | ICD-10-CM

## 2022-05-01 DIAGNOSIS — Z6828 Body mass index (BMI) 28.0-28.9, adult: Secondary | ICD-10-CM

## 2022-05-01 DIAGNOSIS — F411 Generalized anxiety disorder: Secondary | ICD-10-CM | POA: Diagnosis not present

## 2022-05-01 DIAGNOSIS — E663 Overweight: Secondary | ICD-10-CM | POA: Diagnosis not present

## 2022-05-01 DIAGNOSIS — F410 Panic disorder [episodic paroxysmal anxiety] without agoraphobia: Secondary | ICD-10-CM | POA: Diagnosis not present

## 2022-05-01 MED ORDER — BUSPIRONE HCL 10 MG PO TABS: 10.0000 mg | ORAL_TABLET | Freq: Two times a day (BID) | ORAL | 1 refills | Status: DC

## 2022-05-01 MED ORDER — SERTRALINE HCL 25 MG PO TABS: 25.0000 mg | ORAL_TABLET | Freq: Every day | ORAL | 1 refills | Status: DC

## 2022-05-01 NOTE — Assessment & Plan Note (Signed)
Encourage diet and exercise for weight loss 

## 2022-05-01 NOTE — Assessment & Plan Note (Signed)
She does not want to get back on medication for this at this time because she feels like it does make her anxiety worse

## 2022-05-01 NOTE — Progress Notes (Signed)
Subjective:    Patient ID: Laura Dodson, female    DOB: 06-Jul-1980, 42 y.o.   MRN: [id number]  HPI  Patient presents to clinic today for follow-up of chronic conditions.  ADHD: She is not currently taking any medications for this.  She has failed Adderall in the past.  She was taking Strattera prior to her pregnancy.  She is not currently seeing a psychiatrist.  Anxiety/Panic Attacks: Intermittently. Deteriorated because her father is unwell. She takes Lorazepam as needed with good results but she does admit that she is taking this daily.  She is not currently seeing a therapist.  She denies depression, SI/HI.  Review of Systems     Past Medical History:  Diagnosis Date   Anxiety     Current Outpatient Medications  Medication Sig Dispense Refill   Ascorbic Acid (VITAMIN C) 100 MG tablet Take 100 mg by mouth daily.     azithromycin (ZITHROMAX) 250 MG tablet Take 2 tabs today, then 1 tab daily x 4 days 6 tablet 0   cholecalciferol (VITAMIN D3) 25 MCG (1000 UNIT) tablet Take 1,000 Units by mouth daily.     ELDERBERRY PO Take by mouth.     LORazepam (ATIVAN) 0.5 MG tablet TAKE 1 TABLET BY MOUTH EVERY DAY AS NEEDED FOR ANXIETY 30 tablet 0   valACYclovir (VALTREX) 500 MG tablet TAKE 1 TABLET BY MOUTH TWICE A DAY AS NEEDED 180 tablet 1   Zinc 100 MG TABS Take by mouth.     No current facility-administered medications for this visit.    No Known Allergies  Family History  Problem Relation Age of Onset   Healthy Mother    Squamous cell carcinoma Mother    Atrial fibrillation Father    COPD Father    Heart failure Father    Healthy Brother    Healthy Daughter    Squamous cell carcinoma Maternal Aunt    Melanoma Maternal Grandmother    Lung cancer Maternal Grandmother    Cancer Paternal Grandfather    Healthy Brother     Social History   Socioeconomic History   Marital status: Married    Spouse name: Not on file   Number of children: Not on file   Years of education:  Not on file   Highest education level: Associate degree: occupational, Hotel manager, or vocational program  Occupational History   Not on file  Tobacco Use   Smoking status: Former    Packs/day: 0.50    Years: 15.00    Additional pack years: 0.00    Total pack years: 7.50    Types: Cigarettes    Quit date: 2012    Years since quitting: 12.2   Smokeless tobacco: Never  Vaping Use   Vaping Use: Never used  Substance and Sexual Activity   Alcohol use: Yes    Comment: 2-3   Drug use: No   Sexual activity: Not on file  Other Topics Concern   Not on file  Social History Narrative   Not on file   Social Determinants of Health   Financial Resource Strain: Not on file  Food Insecurity: Not on file  Transportation Needs: Not on file  Physical Activity: Not on file  Stress: No Stress Concern Present (05/25/2017)   [place]    Feeling of Stress : Not at all  Social Connections: Not on file  Intimate Partner Violence: Not At Risk (05/25/2017)   Humiliation, Afraid, Rape, and  Kick questionnaire    Fear of Current or Ex-Partner: No    Emotionally Abused: No    Physically Abused: No    Sexually Abused: No     Constitutional: Denies fever, malaise, fatigue, headache or abrupt weight changes.  Respiratory: Denies difficulty breathing, shortness of breath, cough or sputum production.   Cardiovascular: Denies chest pain, chest tightness, palpitations or swelling in the hands or feet.  Gastrointestinal: Denies abdominal pain, bloating, constipation, diarrhea or blood in the stool.  Neurological: Patient reports inattention.  Denies dizziness, difficulty with memory, difficulty with speech or problems with balance and coordination.  Psych: Patient has a history of anxiety.  Denies depression, SI/HI.  No other specific complaints in a complete review of systems (except as listed in HPI above).  Objective:   Physical Exam BP  102/64 (BP Location: Left Arm, Patient Position: Sitting, Cuff Size: Normal)   Pulse 63   Temp (!) 96.8 F (36 C) (Temporal)   Wt 176 lb (79.8 kg)   SpO2 98%   BMI 28.41 kg/m   Wt Readings from Last 3 Encounters:  12/12/21 165 lb (74.8 kg)  12/11/20 182 lb 12.8 oz (82.9 kg)  11/23/19 172 lb 12.8 oz (78.4 kg)    General: Appears her stated age, well developed, well nourished in NAD. Cardiovascular: Normal rate. Pulmonary/Chest: Normal effort. Neurological: Alert and oriented. Coordination normal.  Psychiatric: Tearful. Behavior is normal. Judgment and thought content normal.     BMET    Component Value Date/Time   NA 137 12/12/2021 1514   K 3.8 12/12/2021 1514   CL 103 12/12/2021 1514   CO2 27 12/12/2021 1514   GLUCOSE 84 12/12/2021 1514   BUN 14 12/12/2021 1514   CREATININE 0.77 12/12/2021 1514   CALCIUM 8.7 12/12/2021 1514   GFRNONAA 109 09/16/2019 1028   GFRAA 126 09/16/2019 1028    Lipid Panel     Component Value Date/Time   CHOL 164 12/12/2021 1514   TRIG 52 12/12/2021 1514   HDL 63 12/12/2021 1514   CHOLHDL 2.6 12/12/2021 1514   LDLCALC 88 12/12/2021 1514    CBC    Component Value Date/Time   WBC 5.8 12/12/2021 1514   RBC 3.92 12/12/2021 1514   HGB 12.6 12/12/2021 1514   HCT 37.2 12/12/2021 1514   PLT 216 12/12/2021 1514   MCV 94.9 12/12/2021 1514   MCH 32.1 12/12/2021 1514   MCHC 33.9 12/12/2021 1514   RDW 11.9 12/12/2021 1514   LYMPHSABS 1,780 09/16/2019 1028   EOSABS 60 09/16/2019 1028   BASOSABS 18 09/16/2019 1028    Hgb A1C Lab Results  Component Value Date   HGBA1C 5.3 12/12/2021             Assessment & Plan:      RTC in 7 months for annual exam Webb Silversmith, NP

## 2022-05-01 NOTE — Assessment & Plan Note (Signed)
Will start sertraline 25 mg nightly We will add buspirone 10 mg twice daily as needed Advised her to avoid taking lorazepam daily and to take as needed only for severe anxiety Support offered

## 2022-05-01 NOTE — Patient Instructions (Signed)
Managing Stress, Adult Feeling a certain amount of stress is normal. Stress helps our body and mind get ready to deal with the demands of life. Stress hormones can motivate you to do well at work and meet your responsibilities. But severe or long-term (chronic) stress can affect your mental and physical health. Chronic stress puts you at higher risk for: Anxiety and depression. Other health problems such as digestive problems, muscle aches, heart disease, high blood pressure, and stroke. What are the causes? Common causes of stress include: Demands from work, such as deadlines, feeling overworked, or having long hours. Pressures at home, such as money issues, disagreements with a spouse, or parenting issues. Pressures from major life changes, such as divorce, moving, loss of a loved one, or chronic illness. You may be at higher risk for stress-related problems if you: Do not get enough sleep. Are in poor health. Do not have emotional support. Have a mental health disorder such as anxiety or depression. How to recognize stress Stress can make you: Have trouble sleeping. Feel sad, anxious, irritable, or overwhelmed. Lose your appetite. Overeat or want to eat unhealthy foods. Want to use drugs or alcohol. Stress can also cause physical symptoms, such as: Sore, tense muscles, especially in the shoulders and neck. Headaches. Trouble breathing. A faster heart rate. Stomach pain, nausea, or vomiting. Diarrhea or constipation. Trouble concentrating. Follow these instructions at home: Eating and drinking Eat a healthy diet. This includes: Eating foods that are high in fiber, such as beans, whole grains, and fresh fruits and vegetables. Limiting foods that are high in fat and processed sugars, such as fried or sweet foods. Do not skip meals or overeat. Drink enough fluid to keep your urine pale yellow. Alcohol use Do not drink alcohol if: Your health care provider tells you not to  drink. You are pregnant, may be pregnant, or are planning to become pregnant. Drinking alcohol is a way some people try to ease their stress. This can be dangerous, so if you drink alcohol: Limit how much you have to: 0-1 drink a day for women. 0-2 drinks a day for men. Know how much alcohol is in your drink. In the [place] one drink equals one 12 oz bottle of beer (355 mL), one 5 oz glass of wine (148 mL), or one 1 oz glass of hard liquor (44 mL). Activity  Include 30 minutes of exercise in your daily schedule. Exercise is a good stress reducer. Include time in your day for an activity that you find relaxing. Try taking a walk, going on a bike ride, reading a book, or listening to music. Schedule your time in a way that lowers stress, and keep a regular schedule. Focus on doing what is most important to get done. Lifestyle Identify the source of your stress and your reaction to it. See a therapist who can help you change unhelpful reactions. When there are stressful events: Talk about them with family, friends, or coworkers. Try to think realistically about stressful events and not ignore them or overreact. Try to find the positives in a stressful situation and not focus on the negatives. Cut back on responsibilities at work and home, if possible. Ask for help from friends or family members if you need it. Find ways to manage stress, such as: Mindfulness, meditation, or deep breathing. Yoga or tai chi. Progressive muscle relaxation. Spending time in nature. Doing art, playing music, or reading. Making time for fun activities. Spending time with family and friends. Get support   from family, friends, or spiritual resources. General instructions Get enough sleep. Try to go to sleep and get up at about the same time every day. Take over-the-counter and prescription medicines only as told by your health care provider. Do not use any products that contain nicotine or tobacco. These products  include cigarettes, chewing tobacco, and vaping devices, such as e-cigarettes. If you need help quitting, ask your health care provider. Do not use drugs or smoke to deal with stress. Keep all follow-up visits. This is important. Where to find support Talk with your health care provider about stress management or finding a support group. Find a therapist to work with you on your stress management techniques. Where to find more information National Alliance on Mental Illness: www.nami.org American Psychological Association: www.apa.org Contact a health care provider if: Your stress symptoms get worse. You are unable to manage your stress at home. You are struggling to stop using drugs or alcohol. Get help right away if: You may be a danger to yourself or others. You have any thoughts of death or suicide. Get help right awayif you feel like you may hurt yourself or others, or have thoughts about taking your own life. Go to your nearest emergency room or: Call 911. Call the National Suicide Prevention Lifeline at 1-800-273-8255 or 988 in the [place] This is open 24 hours a day. Text the Crisis Text Line at 741741. Summary Feeling a certain amount of stress is normal, but severe or long-term (chronic) stress can affect your mental and physical health. Chronic stress can put you at higher risk for anxiety, depression, and other health problems such as digestive problems, muscle aches, heart disease, high blood pressure, and stroke. You may be at higher risk for stress-related problems if you do not get enough sleep, are in poor health, lack emotional support, or have a mental health disorder such as anxiety or depression. Identify the source of your stress and your reaction to it. Try talking about stressful events with family, friends, or coworkers, finding a coping method, or getting support from spiritual resources. If you need more help, talk with your health care provider about finding a  support group or a mental health therapist. This information is not intended to replace advice given to you by your health care provider. Make sure you discuss any questions you have with your health care provider. Document Revised: 08/30/2020 Document Reviewed: 08/28/2020 Elsevier Patient Education  2023 Elsevier Inc.  

## 2022-05-06 DIAGNOSIS — N393 Stress incontinence (female) (male): Secondary | ICD-10-CM | POA: Diagnosis not present

## 2022-05-06 DIAGNOSIS — R102 Pelvic and perineal pain: Secondary | ICD-10-CM | POA: Diagnosis not present

## 2022-05-06 DIAGNOSIS — N8110 Cystocele, unspecified: Secondary | ICD-10-CM | POA: Diagnosis not present

## 2022-05-10 IMAGING — MR MR HEAD WO/W CM
3 series · 48 of 48 positions shown · IV contrast (gadavist)
Comparison: None.

CLINICAL DATA: [id number] long hauler.

EXAM:
MRI HEAD WITHOUT AND WITH CONTRAST
TECHNIQUE: Multiplanar, multiecho pulse sequences of the brain and surrounding
structures were obtained without and with intravenous contrast.
Additionally, using NeuroQuant software a 3D volumetric analysis of
the brain was performed and is compared to a normative database
adjusted for age, gender and intracranial volume.
CONTRAST:  7mL GADAVIST GADOBUTROL 1 MMOL/ML IV SOLN

[Series 52: nqsegcb_sc_cor · 1.00mm/px · 17 of 223 slices shown]
[im 1/223]
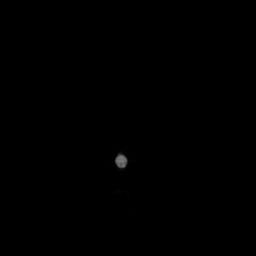
[im 14/223]
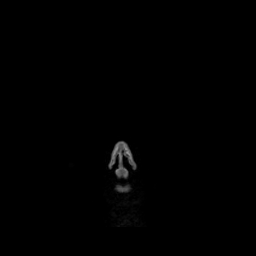
[im 28/223]
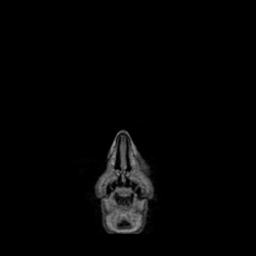
[im 42/223]
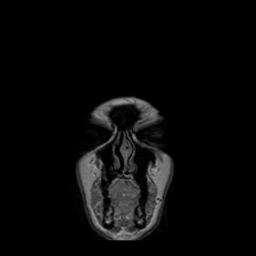
[im 56/223]
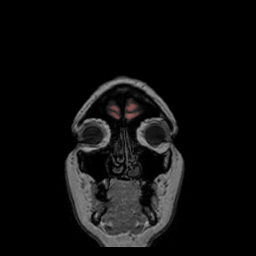
[im 70/223]
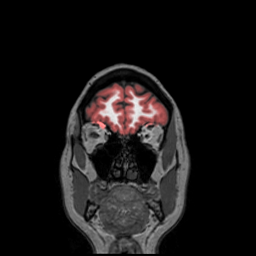
[im 84/223]
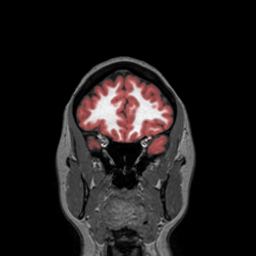
[im 98/223]
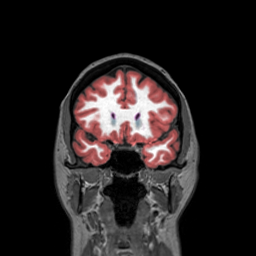
[im 112/223]
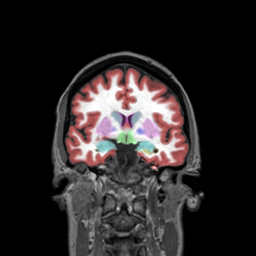
[im 125/223]
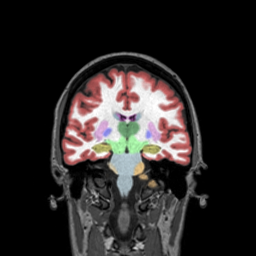
[im 139/223]
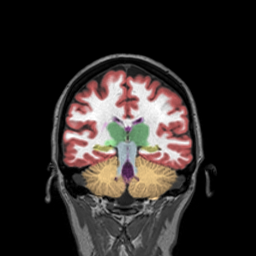
[im 153/223]
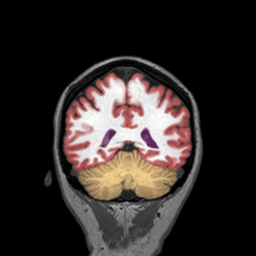
[im 167/223]
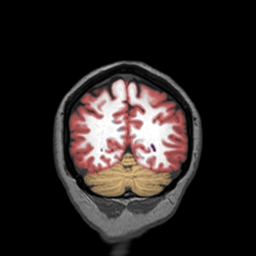
[im 181/223]
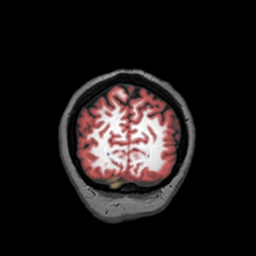
[im 195/223]
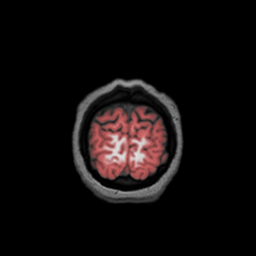
[im 209/223]
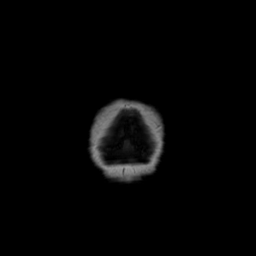
[im 223/223]
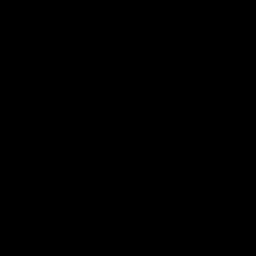

[Series 53: nqsegcb_sc_axl · 1.00mm/px · 16 of 213 slices shown]
[im 1/213]
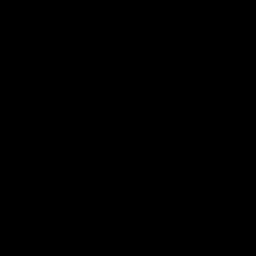
[im 15/213]
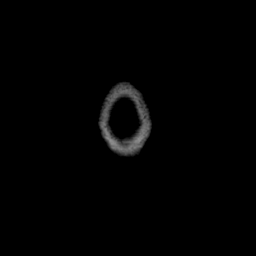
[im 29/213]
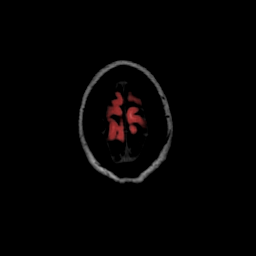
[im 43/213]
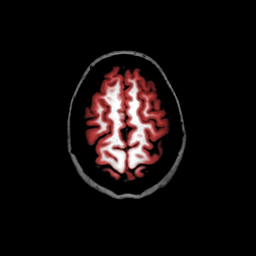
[im 57/213]
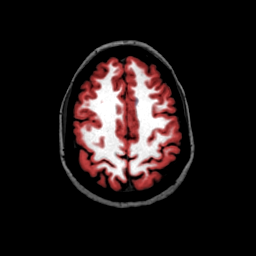
[im 71/213]
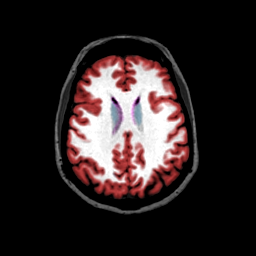
[im 85/213]
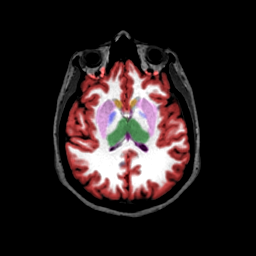
[im 99/213]
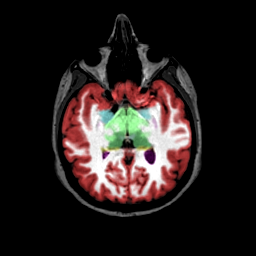
[im 114/213]
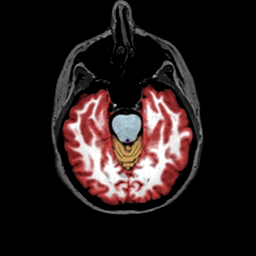
[im 128/213]
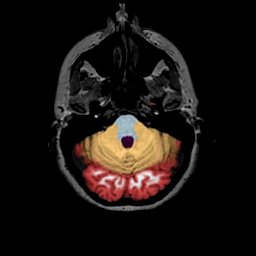
[im 142/213]
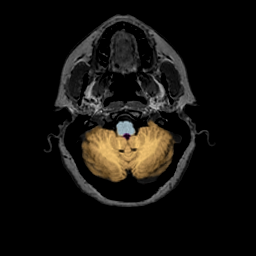
[im 156/213]
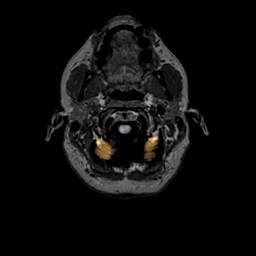
[im 170/213]
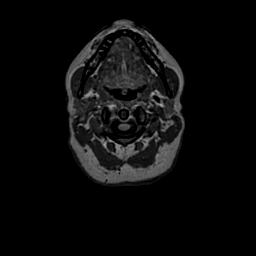
[im 184/213]
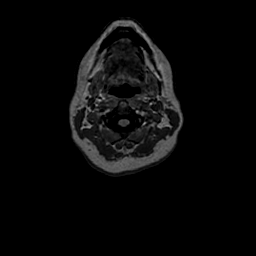
[im 198/213]
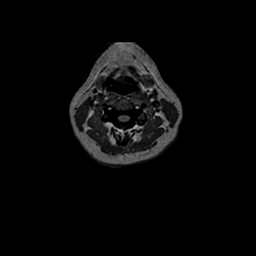
[im 213/213]
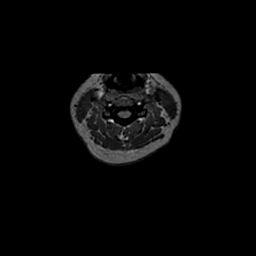

[Series 54: nqsegcb_sc_sag · 1.00mm/px · 15 of 193 slices shown]
[im 1/193]
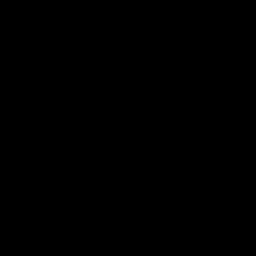
[im 14/193]
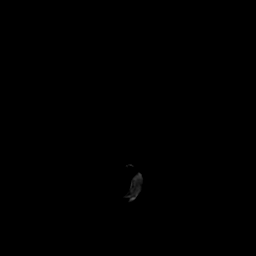
[im 28/193]
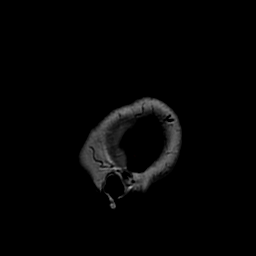
[im 42/193]
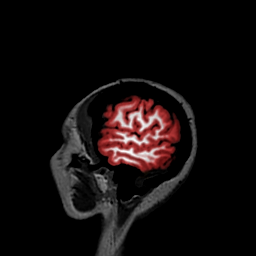
[im 55/193]
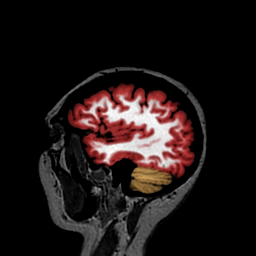
[im 69/193]
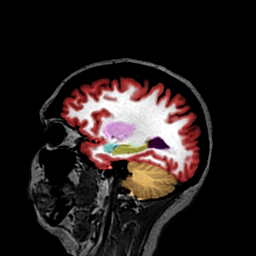
[im 83/193]
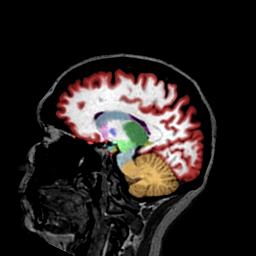
[im 97/193]
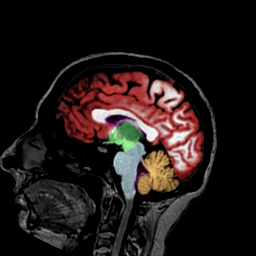
[im 110/193]
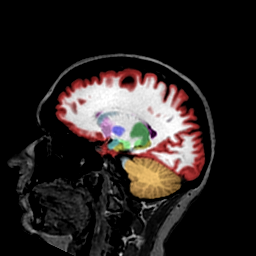
[im 124/193]
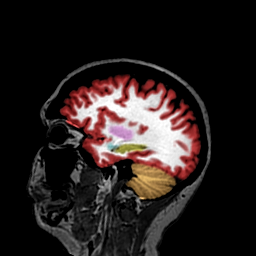
[im 138/193]
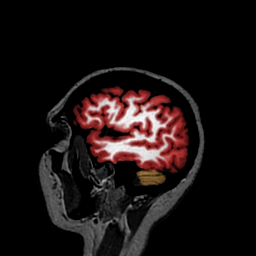
[im 151/193]
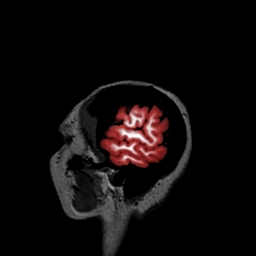
[im 165/193]
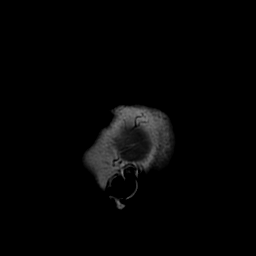
[im 179/193]
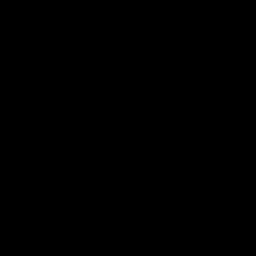
[im 193/193]
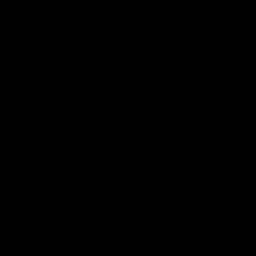

[48 of 48 positions shown; findings below may reference images not displayed]

FINDINGS: Brain: No acute infarction, hemorrhage, hydrocephalus, extra-axial
collection or mass lesion.

A few scattered foci of T2 hyperintensity, nonspecific.

Vascular: Normal flow voids.

Skull and upper cervical spine: Normal marrow signal.

Sinuses/Orbits: Trace mucosal thickening throughout the paranasal
sinuses. The orbits are maintained.

Other: Trace bilateral mastoid effusion.

NeuroQuant Findings:

Volumetric analysis of the brain was performed, with a fully
detailed report in [HOSPITAL] PACS. Briefly, the comparison with age and
gender matched reference reveals whole brain volume within the
thirty-seventh percentile.
IMPRESSION: 1. No acute intracranial abnormality.
2. A few scattered foci of T2 hyperintensity, nonspecific may be
seen in the setting of migraine headaches, early microvascular
changes, demyelination and post inflammatory/infectious process.
3. NeuroQuant volumetric analysis of the brain, see details on
[HOSPITAL] PACS.

## 2022-05-15 ENCOUNTER — Ambulatory Visit: Payer: Self-pay

## 2022-05-15 NOTE — Telephone Encounter (Signed)
  Chief Complaint: cough Symptoms: cough with congestion, green mucus Frequency: 8 days  Pertinent Negatives: Patient denies SOB or fever Disposition: [] ED /[] Urgent Care (no appt availability in office) / [x] Appointment(In office/virtual)/ []  [facility]/ [] Home Care/ [] Refused Recommended Disposition /[] [facility]/ []  Follow-up with PCP Additional Notes: pt states she has tried a lot of OTC including Neti-pot, Elderberry, Mucinex and normally sx don't last this long for her but the cough has gotten worse. Unable to come in for OV so scheduled VV tomorrow at 1120 with Dr. Raliegh Ip.   Summary: constant cough   Patient called in has constant cough, asking for something to help with it, like pearl pills or Z pac, , can't do appt tomorrow and next isnt until April 8     Reason for Disposition  [1] Continuous (nonstop) coughing interferes with work or school AND [2] no improvement using cough treatment per Care Advice  Answer Assessment - Initial Assessment Questions 1. ONSET: "When did the cough begin?"      8 days  2. SEVERITY: "How bad is the cough today?"      Gotten worse  3. SPUTUM: "Describe the color of your sputum" (none, dry cough; clear, white, yellow, green)     Green  5. DIFFICULTY BREATHING: "Are you having difficulty breathing?" If Yes, ask: "How bad is it?" (e.g., mild, moderate, severe)    - MILD: No SOB at rest, mild SOB with walking, speaks normally in sentences, can lie down, no retractions, pulse < 100.    - MODERATE: SOB at rest, SOB with minimal exertion and prefers to sit, cannot lie down flat, speaks in phrases, mild retractions, audible wheezing, pulse 100-120.    - SEVERE: Very SOB at rest, speaks in single words, struggling to breathe, sitting hunched forward, retractions, pulse > 120      no 6. FEVER: "Do you have a fever?" If Yes, ask: "What is your temperature, how was it measured, and when did it start?"     no 10. OTHER SYMPTOMS: "Do you have  any other symptoms?" (e.g., runny nose, wheezing, chest pain)       Chest congestion  Protocols used: Cough - Acute Productive-A-AH

## 2022-05-16 ENCOUNTER — Telehealth (INDEPENDENT_AMBULATORY_CARE_PROVIDER_SITE_OTHER): Payer: BC Managed Care – PPO | Admitting: Family Medicine

## 2022-05-16 ENCOUNTER — Encounter: Payer: Self-pay | Admitting: Family Medicine

## 2022-05-16 VITALS — Wt 176.0 lb

## 2022-05-16 DIAGNOSIS — J0110 Acute frontal sinusitis, unspecified: Secondary | ICD-10-CM | POA: Diagnosis not present

## 2022-05-16 MED ORDER — AMOXICILLIN-POT CLAVULANATE 875-125 MG PO TABS: 1.0000 | ORAL_TABLET | Freq: Two times a day (BID) | ORAL | 0 refills | Status: DC

## 2022-05-16 MED ORDER — BENZONATATE 100 MG PO CAPS: 100.0000 mg | ORAL_CAPSULE | Freq: Three times a day (TID) | ORAL | 0 refills | Status: DC | PRN

## 2022-05-16 MED ORDER — IPRATROPIUM BROMIDE 0.06 % NA SOLN: 2.0000 | Freq: Four times a day (QID) | NASAL | 0 refills | Status: DC

## 2022-05-16 MED ORDER — PREDNISONE 10 MG PO TABS: ORAL_TABLET | ORAL | 0 refills | Status: DC

## 2022-05-16 NOTE — Patient Instructions (Addendum)
1. It sounds like you have a Sinusitis (Bacterial Infection) - this most likely started as an Upper Respiratory Virus that has settled into an infection. Allergies can also cause this. - Start Augmentin 1 pill twice daily (breakfast and dinner, with food and plenty of water) for 10 days, complete entire course, do not stop early even if feeling better - Start prednisone 6 day taper steroid - Start Atrovent nasal spray decongestant 2 sprays in each nostril up to 4 times daily for 7 days - Start [employer] take 1 capsule up to 3 times a day as needed for cough - Improve hydration by drinking plenty of clear fluids (water, gatorade) to reduce secretions and thin congestion - Congestion draining down throat can cause irritation. May try warm herbal tea with honey, cough drops - Can take Tylenol or Ibuprofen as needed for fevers - May continue over the counter cold medicine as you are, I would not use any decongestant or mucinex longer than 7 days.   Please schedule a Follow-up Appointment to: Return if symptoms worsen or fail to improve.  If you have any other questions or concerns, please feel free to call the office or send a message through [place]. You may also schedule an earlier appointment if necessary.  Additionally, you may be receiving a survey about your experience at our office within a few days to 1 week by e-mail or mail. We value your feedback.  Nobie Putnam, DO [place]

## 2022-05-16 NOTE — Progress Notes (Signed)
Subjective:    Patient ID: Laura Dodson, female    DOB: 1980/02/26, 42 y.o.   MRN: [id number]  Laura Dodson is a 42 y.o. female presenting on 05/16/2022 for Cough and Nasal Congestion  PCP Webb Silversmith, FNP   Virtual / Telehealth Encounter - Video Visit via MyChart The purpose of this virtual visit is to provide medical care while limiting exposure to the novel coronavirus (COVID19) for both patient and office staff.  Consent was obtained for remote visit:  Yes.   Answered questions that patient had about telehealth interaction:  Yes.   I discussed the limitations, risks, security and privacy concerns of performing an evaluation and management service by video/telephone. I also discussed with the patient that there may be a patient responsible charge related to this service. The patient expressed understanding and agreed to proceed.  Patient Location: Home Provider Location: [facility] (Office)  Participants in virtual visit: - Patient: Laura Dodson - CMA: Orinda Kenner, CMA - Provider: Dr Parks Ranger   HPI  Sinusitis Duration now 8-9 days, some improved but still productive and sinus pressure congestion. Some cough worse at night bronchospasm tight coughing spell. No history asthma. Usually does not get sick Tried Netti pot, mucinex, flonase Not blowing out thick green, but still productive Sick contacts with 12 month old with croup and another viral infection from pre-school Denies fever chills nausea vomiting   Her father passed away on hospice at home this morning.     05/01/2022    9:32 AM 12/12/2021    2:54 PM 12/11/2020    9:18 AM  Depression screen PHQ 2/9  Decreased Interest 0 0 1  Down, Depressed, Hopeless 2 0 1  PHQ - 2 Score 2 0 2  Altered sleeping 2 0 0  Tired, decreased energy 3 0 0  Change in appetite 2 0 0  Feeling bad or failure about yourself  0 0 1  Trouble concentrating 3 0 0  Moving slowly or fidgety/restless 0 0 0   Suicidal thoughts 0 0 0  PHQ-9 Score 12 0 3  Difficult doing work/chores Very difficult Not difficult at all Not difficult at all    Social History   Tobacco Use   Smoking status: Former    Packs/day: 0.50    Years: 15.00    Additional pack years: 0.00    Total pack years: 7.50    Types: Cigarettes    Quit date: 2012    Years since quitting: 12.2   Smokeless tobacco: Never  Vaping Use   Vaping Use: Never used  Substance Use Topics   Alcohol use: Yes    Comment: 2-3   Drug use: No    Review of Systems Per HPI unless specifically indicated above     Objective:    Wt 176 lb (79.8 kg)   BMI 28.41 kg/m   Wt Readings from Last 3 Encounters:  05/16/22 176 lb (79.8 kg)  05/01/22 176 lb (79.8 kg)  12/12/21 165 lb (74.8 kg)    Physical Exam  Note examination was completely remotely via video observation objective data only  Gen - well-appearing, no acute distress or apparent pain, comfortable HEENT - eyes appear clear without discharge or redness, congestion Heart/Lungs - cannot examine virtually - cough Abd - cannot examine virtually  Skin - face visible today- no rash Neuro - awake, alert, oriented Psych - not anxious appearing  Results for orders placed or performed in visit on 01/02/22  HM  PAP SMEAR  Result Value Ref Range   HM Pap smear WNL   Results Console HPV  Result Value Ref Range   CHL HPV Negative       Assessment & Plan:   Problem List Items Addressed This Visit   None Visit Diagnoses     Acute non-recurrent frontal sinusitis    -  Primary   Relevant Medications   amoxicillin-clavulanate (AUGMENTIN) 875-125 MG tablet   predniSONE (DELTASONE) 10 MG tablet   benzonatate (TESSALON) 100 MG capsule   ipratropium (ATROVENT) 0.06 % nasal spray       Consistent with acute frontal sinusitis, likely initially viral URI vs allergic rhinitis component with worsening concern for bacterial infection.   Plan: 1.  Start Augmentin 875-125mg  PO BID x  10 days 2. Start Atrovent nasal spray decongestant 2 sprays in each nostril up to 4 times daily for 7 days 3. Start Tessalon Perls take 1 capsule up to 3 times a day as needed for cough 4. Start Prednisone taper 6 day Return criteria reviewed   Meds ordered this encounter  Medications   amoxicillin-clavulanate (AUGMENTIN) 875-125 MG tablet    Sig: Take 1 tablet by mouth 2 (two) times daily.    Dispense:  20 tablet    Refill:  0   predniSONE (DELTASONE) 10 MG tablet    Sig: Take 6 tabs with breakfast Day 1, 5 tabs Day 2, 4 tabs Day 3, 3 tabs Day 4, 2 tabs Day 5, 1 tab Day 6.    Dispense:  21 tablet    Refill:  0   benzonatate (TESSALON) 100 MG capsule    Sig: Take 1 capsule (100 mg total) by mouth 3 (three) times daily as needed for cough.    Dispense:  30 capsule    Refill:  0   ipratropium (ATROVENT) 0.06 % nasal spray    Sig: Place 2 sprays into both nostrils 4 (four) times daily. For up to 5-7 days then stop.    Dispense:  15 mL    Refill:  0     Follow up plan: Return if symptoms worsen or fail to improve.   Patient verbalizes understanding with the above medical recommendations including the limitation of remote medical advice.  Specific follow-up and call-back criteria were given for patient to follow-up or seek medical care more urgently if needed.  Total duration of direct patient care provided via video conference: 10 minutes   Nobie Putnam, [facility] 05/16/2022, 11:35 AM

## 2022-06-07 ENCOUNTER — Other Ambulatory Visit: Payer: Self-pay | Admitting: Family Medicine

## 2022-06-07 DIAGNOSIS — J0110 Acute frontal sinusitis, unspecified: Secondary | ICD-10-CM

## 2022-06-09 NOTE — Telephone Encounter (Signed)
Requested medication (s) are due for refill today: yes  Requested medication (s) are on the active medication list: yes  Last refill:  05/16/22  Future visit scheduled: yes  Notes to clinic:   Medication not assigned to a protocol, review manually      Requested Prescriptions  Pending Prescriptions Disp Refills   ipratropium (ATROVENT) 0.06 % nasal spray [Pharmacy Med Name: IPRATROPIUM 0.06% SPRAY]  1    Sig: Place 2 sprays into both nostrils 4 (four) times daily. For up to 5-7 days then stop.     Off-Protocol Failed - 06/07/2022 10:31 AM      Failed - Medication not assigned to a protocol, review manually.      Passed - Valid encounter within last 12 months    Recent Outpatient Visits           3 weeks ago Acute non-recurrent frontal sinusitis   [place] [place], Netta Neat, DO   1 month ago Attention deficit hyperactivity disorder (ADHD), combined type   [place] [facility] [place], Salvadore Oxford, NP   5 months ago Encounter for general adult medical examination with abnormal findings   [place] [facility] [place], Salvadore Oxford, NP   1 year ago GAD (generalized anxiety disorder)   [place] [facility] [employer], Salvadore Oxford, NP   2 years ago Attention deficit hyperactivity disorder (ADHD), unspecified ADHD type   [facility], [place]             Off-Protocol Failed - 06/07/2022 10:31 AM      Failed - Medication not assigned to a protocol, review manually.      Passed - Valid encounter within last 12 months    Recent Outpatient Visits           3 weeks ago Acute non-recurrent frontal sinusitis   [place] [facility] [place], Netta Neat, DO   1 month ago Attention deficit hyperactivity disorder (ADHD), combined type   [place] [facility] [place], Salvadore Oxford, NP   5 months ago Encounter for general adult medical  examination with abnormal findings   [place] [facility] [place], Salvadore Oxford, NP   1 year ago GAD (generalized anxiety disorder)   [place] [facility] [facility] [place], Salvadore Oxford, NP   2 years ago Attention deficit hyperactivity disorder (ADHD), unspecified ADHD type    [place] [facility], Jodelle Gross, [place]

## 2022-07-02 ENCOUNTER — Other Ambulatory Visit: Payer: Self-pay | Admitting: Internal Medicine

## 2022-07-02 DIAGNOSIS — J0110 Acute frontal sinusitis, unspecified: Secondary | ICD-10-CM

## 2022-07-02 NOTE — Telephone Encounter (Signed)
Unable to refill per protocol, Rx request is too soon.  Requested Prescriptions  Pending Prescriptions Disp Refills   ipratropium (ATROVENT) 0.06 % nasal spray [Pharmacy Med Name: IPRATROPIUM 0.06% SPRAY]  1    Sig: PLACE 2 SPRAYS INTO BOTH NOSTRILS 4 (FOUR) TIMES DAILY. FOR UP TO 5-7 DAYS THEN STOP.     Off-Protocol Failed - 07/02/2022 10:32 AM      Failed - Medication not assigned to a protocol, review manually.      Passed - Valid encounter within last 12 months    Recent Outpatient Visits           1 month ago Acute non-recurrent frontal sinusitis   [facility] [facility] [place], Netta Neat, DO   2 months ago Attention deficit hyperactivity disorder (ADHD), combined type   [place] [facility] [place], Salvadore Oxford, NP   6 months ago Encounter for general adult medical examination with abnormal findings   [place] [facility] [place], Salvadore Oxford, NP   1 year ago GAD (generalized anxiety disorder)   [place] [facility] [place], Salvadore Oxford, NP   2 years ago Attention deficit hyperactivity disorder (ADHD), unspecified ADHD type   [facility], Jodelle Gross, FNP             Off-Protocol Failed - 07/02/2022 10:32 AM      Failed - Medication not assigned to a protocol, review manually.      Passed - Valid encounter within last 12 months    Recent Outpatient Visits           1 month ago Acute non-recurrent frontal sinusitis   [place] [facility] [place], Netta Neat, DO   2 months ago Attention deficit hyperactivity disorder (ADHD), combined type   [place] [facility] [place], Salvadore Oxford, NP   6 months ago Encounter for general adult medical examination with abnormal findings   [place] [facility] [place], Salvadore Oxford, NP   1 year ago GAD (generalized anxiety disorder)   [place] [facility] [place], Salvadore Oxford,  NP   2 years ago Attention deficit hyperactivity disorder (ADHD), unspecified ADHD type   [place] [facility], Jodelle Gross, [place]

## 2022-07-04 DIAGNOSIS — F53 Postpartum depression: Secondary | ICD-10-CM | POA: Diagnosis not present

## 2022-07-04 DIAGNOSIS — F902 Attention-deficit hyperactivity disorder, combined type: Secondary | ICD-10-CM | POA: Diagnosis not present

## 2022-07-04 DIAGNOSIS — F411 Generalized anxiety disorder: Secondary | ICD-10-CM | POA: Diagnosis not present

## 2022-07-11 DIAGNOSIS — F53 Postpartum depression: Secondary | ICD-10-CM | POA: Diagnosis not present

## 2022-07-11 DIAGNOSIS — F411 Generalized anxiety disorder: Secondary | ICD-10-CM | POA: Diagnosis not present

## 2022-07-11 DIAGNOSIS — F902 Attention-deficit hyperactivity disorder, combined type: Secondary | ICD-10-CM | POA: Diagnosis not present

## 2022-09-01 DIAGNOSIS — R92333 Mammographic heterogeneous density, bilateral breasts: Secondary | ICD-10-CM | POA: Diagnosis not present

## 2022-09-01 DIAGNOSIS — N644 Mastodynia: Secondary | ICD-10-CM | POA: Diagnosis not present

## 2022-09-03 ENCOUNTER — Other Ambulatory Visit: Payer: Self-pay | Admitting: Internal Medicine

## 2022-09-04 NOTE — Telephone Encounter (Signed)
Requested medication (s) are due for refill today - yes  Requested medication (s) are on the active medication list -yes  Future visit scheduled -no  Last refill: 04/09/22 #30  Notes to clinic: non delegated Rx  Requested Prescriptions  Pending Prescriptions Disp Refills   LORazepam (ATIVAN) 0.5 MG tablet [Pharmacy Med Name: LORAZEPAM 0.5 MG TABLET] 30 tablet 0    Sig: TAKE 1 TABLET BY MOUTH EVERY DAY AS NEEDED FOR ANXIETY     Not Delegated - Psychiatry: Anxiolytics/Hypnotics 2 Failed - 09/03/2022  4:11 PM      Failed - This refill cannot be delegated      Failed - Urine Drug Screen completed in last 360 days      Passed - Patient is not pregnant      Passed - Valid encounter within last 6 months    Recent Outpatient Visits           3 months ago Acute non-recurrent frontal sinusitis   [facility] [place], Netta Neat, DO   4 months ago Attention deficit hyperactivity disorder (ADHD), combined type   [place] [facility] [place], Salvadore Oxford, NP   8 months ago Encounter for general adult medical examination with abnormal findings   [place] [facility] [place], Salvadore Oxford, NP   1 year ago GAD (generalized anxiety disorder)   [place] [facility] [place], Salvadore Oxford, NP   2 years ago Attention deficit hyperactivity disorder (ADHD), unspecified ADHD type   [facility], Jodelle Gross, [place]                 Requested Prescriptions  Pending Prescriptions Disp Refills   LORazepam (ATIVAN) 0.5 MG tablet [Pharmacy Med Name: LORAZEPAM 0.5 MG TABLET] 30 tablet 0    Sig: TAKE 1 TABLET BY MOUTH EVERY DAY AS NEEDED FOR ANXIETY     Not Delegated - Psychiatry: Anxiolytics/Hypnotics 2 Failed - 09/03/2022  4:11 PM      Failed - This refill cannot be delegated      Failed - Urine Drug Screen completed in last 360 days      Passed - Patient is not pregnant      Passed - Valid  encounter within last 6 months    Recent Outpatient Visits           3 months ago Acute non-recurrent frontal sinusitis   [facility] [place], Netta Neat, DO   4 months ago Attention deficit hyperactivity disorder (ADHD), combined type   [place] [facility] [place], Salvadore Oxford, NP   8 months ago Encounter for general adult medical examination with abnormal findings   [place] [facility] [place], Salvadore Oxford, NP   1 year ago GAD (generalized anxiety disorder)   [place] [facility] [place], Salvadore Oxford, NP   2 years ago Attention deficit hyperactivity disorder (ADHD), unspecified ADHD type   [facility], Jodelle Gross, [place]

## 2022-10-25 ENCOUNTER — Other Ambulatory Visit: Payer: Self-pay | Admitting: Internal Medicine

## 2022-10-27 NOTE — Telephone Encounter (Signed)
Patient will need an office visit for further refills. Requested Prescriptions  Pending Prescriptions Disp Refills   busPIRone (BUSPAR) 10 MG tablet [Pharmacy Med Name: BUSPIRONE HCL 10 MG TABLET] 180 tablet 0    Sig: TAKE 1 TABLET BY MOUTH TWICE A DAY     Psychiatry: Anxiolytics/Hypnotics - Non-controlled Passed - 10/25/2022  9:21 AM      Passed - Valid encounter within last 12 months    Recent Outpatient Visits           5 months ago Acute non-recurrent frontal sinusitis   [facility] [place], Netta Neat, DO   5 months ago Attention deficit hyperactivity disorder (ADHD), combined type   [place] [facility] [place], Salvadore Oxford, NP   10 months ago Encounter for general adult medical examination with abnormal findings   [place] [facility] [place], Salvadore Oxford, NP   1 year ago GAD (generalized anxiety disorder)   [place] [facility] [place], Salvadore Oxford, NP   2 years ago Attention deficit hyperactivity disorder (ADHD), unspecified ADHD type   [place] [facility], Jodelle Gross, [place]

## 2022-11-04 DIAGNOSIS — Z01419 Encounter for gynecological examination (general) (routine) without abnormal findings: Secondary | ICD-10-CM | POA: Diagnosis not present

## 2022-11-04 DIAGNOSIS — Z1331 Encounter for screening for depression: Secondary | ICD-10-CM | POA: Diagnosis not present

## 2022-12-26 ENCOUNTER — Encounter: Payer: Self-pay | Admitting: Internal Medicine

## 2022-12-26 ENCOUNTER — Ambulatory Visit (INDEPENDENT_AMBULATORY_CARE_PROVIDER_SITE_OTHER): Payer: BC Managed Care – PPO | Admitting: Internal Medicine

## 2022-12-26 VITALS — BP 116/68 | Ht 66.0 in | Wt 175.0 lb

## 2022-12-26 DIAGNOSIS — R739 Hyperglycemia, unspecified: Secondary | ICD-10-CM | POA: Diagnosis not present

## 2022-12-26 DIAGNOSIS — E663 Overweight: Secondary | ICD-10-CM

## 2022-12-26 DIAGNOSIS — Z0001 Encounter for general adult medical examination with abnormal findings: Secondary | ICD-10-CM

## 2022-12-26 DIAGNOSIS — R635 Abnormal weight gain: Secondary | ICD-10-CM

## 2022-12-26 DIAGNOSIS — K5900 Constipation, unspecified: Secondary | ICD-10-CM | POA: Diagnosis not present

## 2022-12-26 DIAGNOSIS — Z136 Encounter for screening for cardiovascular disorders: Secondary | ICD-10-CM | POA: Diagnosis not present

## 2022-12-26 DIAGNOSIS — Z6828 Body mass index (BMI) 28.0-28.9, adult: Secondary | ICD-10-CM

## 2022-12-26 NOTE — Patient Instructions (Signed)

## 2022-12-26 NOTE — Progress Notes (Unsigned)
Subjective:    Patient ID: Laura Dodson, female    DOB: May 17, 1980, 42 y.o.   MRN: [id number]  HPI  Patient presents to clinic today for her annual exam.  Flu: Never Tetanus: 08/2020 COVID: Never Pap smear: 09/2021 Mammogram: 08/2022 Vision screening: annually Dentist: biannually  Diet: She does eat meat. She consumes fruits and veggies. She tries to avoid fried foods. She drinks mostly water, coffee. Exercise: Walking, weights   Review of Systems     Past Medical History:  Diagnosis Date   Anxiety     Current Outpatient Medications  Medication Sig Dispense Refill   amoxicillin-clavulanate (AUGMENTIN) 875-125 MG tablet Take 1 tablet by mouth 2 (two) times daily. 20 tablet 0   Ascorbic Acid (VITAMIN C) 100 MG tablet Take 100 mg by mouth daily.     benzonatate (TESSALON) 100 MG capsule Take 1 capsule (100 mg total) by mouth 3 (three) times daily as needed for cough. 30 capsule 0   busPIRone (BUSPAR) 10 MG tablet TAKE 1 TABLET BY MOUTH TWICE A DAY 180 tablet 0   cholecalciferol (VITAMIN D3) 25 MCG (1000 UNIT) tablet Take 1,000 Units by mouth daily.     ELDERBERRY PO Take by mouth.     ipratropium (ATROVENT) 0.06 % nasal spray PLACE 2 SPRAYS INTO BOTH NOSTRILS 4 (FOUR) TIMES DAILY. FOR UP TO 5-7 DAYS THEN STOP. 15 mL 1   LORazepam (ATIVAN) 0.5 MG tablet TAKE 1 TABLET BY MOUTH EVERY DAY AS NEEDED FOR ANXIETY 30 tablet 0   predniSONE (DELTASONE) 10 MG tablet Take 6 tabs with breakfast Day 1, 5 tabs Day 2, 4 tabs Day 3, 3 tabs Day 4, 2 tabs Day 5, 1 tab Day 6. 21 tablet 0   sertraline (ZOLOFT) 25 MG tablet Take 1 tablet (25 mg total) by mouth daily. 90 tablet 1   valACYclovir (VALTREX) 500 MG tablet TAKE 1 TABLET BY MOUTH TWICE A DAY AS NEEDED 180 tablet 1   Zinc 100 MG TABS Take by mouth.     No current facility-administered medications for this visit.    No Known Allergies  Family History  Problem Relation Age of Onset   Healthy Mother    Squamous cell carcinoma Mother     Atrial fibrillation Father    COPD Father    Heart failure Father    Healthy Brother    Healthy Daughter    Squamous cell carcinoma Maternal Aunt    Melanoma Maternal Grandmother    Lung cancer Maternal Grandmother    Cancer Paternal Grandfather    Healthy Brother     Social History   Socioeconomic History   Marital status: Married    Spouse name: Not on file   Number of children: Not on file   Years of education: Not on file   Highest education level: Associate degree: occupational, Scientist, product/process development, or vocational program  Occupational History   Not on file  Tobacco Use   Smoking status: Former    Current packs/day: 0.00    Average packs/day: 0.5 packs/day for 15.0 years (7.5 ttl pk-yrs)    Types: Cigarettes    Start date: 76    Quit date: 2012    Years since quitting: 12.8   Smokeless tobacco: Never  Vaping Use   Vaping status: Never Used  Substance and Sexual Activity   Alcohol use: Yes    Comment: 2-3   Drug use: No   Sexual activity: Not on file  Other Topics Concern  Not on file  Social History Narrative   Not on file   Social Determinants of Health   Financial Resource Strain: Not on file  Food Insecurity: Not on file  Transportation Needs: Not on file  Physical Activity: Not on file  Stress: No Stress Concern Present (05/25/2017)   [employer] of Occupational Health - Occupational Stress Questionnaire    Feeling of Stress : Not at all  Social Connections: Unknown (07/02/2021)   [facility]   Social Network    Social Network: Not on file  Intimate Partner Violence: Unknown (05/24/2021)   [facility]   HITS    Physically Hurt: Not on file    Insult or Talk Down To: Not on file    Threaten Physical Harm: Not on file    Scream or Curse: Not on file     Constitutional: Denies fever, malaise, fatigue, headache or abrupt weight changes.  HEENT: Denies eye pain, eye redness, ear pain,  ringing in the ears, wax buildup, runny nose, nasal congestion, bloody nose, or sore throat. Respiratory: Denies difficulty breathing, shortness of breath, cough or sputum production.   Cardiovascular: Denies chest pain, chest tightness, palpitations or swelling in the hands or feet.  Gastrointestinal: Patient reports constipation.  Denies abdominal pain, bloating, diarrhea or blood in the stool.  GU: Denies urgency, frequency, pain with urination, burning sensation, blood in urine, odor or discharge. Musculoskeletal: Denies decrease in range of motion, difficulty with gait, muscle pain or joint pain and swelling.  Skin: Denies redness, rashes, lesions or ulcercations.  Neurological: Patient reports inattention.  Denies dizziness, difficulty with memory, difficulty with speech or problems with balance and coordination.  Psych: Patient is a history of anxiety.  Denies depression, SI/HI.  No other specific complaints in a complete review of systems (except as listed in HPI above).  Objective:   Physical Exam  BP 116/68   Ht 5\' 6"  (1.676 m)   Wt 175 lb (79.4 kg)   BMI 28.25 kg/m   Wt Readings from Last 3 Encounters:  05/16/22 176 lb (79.8 kg)  05/01/22 176 lb (79.8 kg)  12/12/21 165 lb (74.8 kg)    General: Appears her stated age, overweight in NAD. Skin: Warm, dry and intact.  HEENT: Head: normal shape and size; Eyes: sclera white, no icterus, conjunctiva pink, PERRLA and EOMs intact;  Neck:  Neck supple, trachea midline. No masses, lumps or thyromegaly present.  Cardiovascular: Normal rate and rhythm. S1,S2 noted.  No murmur, rubs or gallops noted. No JVD or BLE edema.  Pulmonary/Chest: Normal effort and positive vesicular breath sounds. No respiratory distress. No wheezes, rales or ronchi noted.  Abdomen: Soft and nontender. Normal bowel sounds.  Musculoskeletal: Strength 5/5 BUE/BLE. No difficulty with gait.  Neurological: Alert and oriented. Cranial nerves II-XII grossly intact.  Coordination normal.  Psychiatric: Mood and affect normal. Behavior is normal. Judgment and thought content normal.     BMET    Component Value Date/Time   NA 137 12/12/2021 1514   K 3.8 12/12/2021 1514   CL 103 12/12/2021 1514   CO2 27 12/12/2021 1514   GLUCOSE 84 12/12/2021 1514   BUN 14 12/12/2021 1514   CREATININE 0.77 12/12/2021 1514   CALCIUM 8.7 12/12/2021 1514   GFRNONAA 109 09/16/2019 1028   GFRAA 126 09/16/2019 1028    Lipid Panel     Component Value Date/Time   CHOL 164 12/12/2021 1514   TRIG 52 12/12/2021  1514   HDL 63 12/12/2021 1514   CHOLHDL 2.6 12/12/2021 1514   LDLCALC 88 12/12/2021 1514    CBC    Component Value Date/Time   WBC 5.8 12/12/2021 1514   RBC 3.92 12/12/2021 1514   HGB 12.6 12/12/2021 1514   HCT 37.2 12/12/2021 1514   PLT 216 12/12/2021 1514   MCV 94.9 12/12/2021 1514   MCH 32.1 12/12/2021 1514   MCHC 33.9 12/12/2021 1514   RDW 11.9 12/12/2021 1514   LYMPHSABS 1,780 09/16/2019 1028   EOSABS 60 09/16/2019 1028   BASOSABS 18 09/16/2019 1028    Hgb A1C Lab Results  Component Value Date   HGBA1C 5.3 12/12/2021           Assessment & Plan:   Preventative health maintenance:  She declines flu shot Tetanus UTD Encouraged her to get her COVID-vaccine Pap smear UTD Mammogram ordered by GYN Encouraged to consume a balanced diet and exercise regimen Advised her to see an eye doctor and dentist annually Will check CBC, c-Met, lipid, A1c today  RTC in 6 months, follow-up chronic conditions Nicki Reaper, NP

## 2022-12-29 NOTE — Assessment & Plan Note (Signed)
Encouraged diet and exercise for weight loss ?

## 2022-12-31 DIAGNOSIS — K5900 Constipation, unspecified: Secondary | ICD-10-CM | POA: Diagnosis not present

## 2023-01-01 ENCOUNTER — Other Ambulatory Visit: Payer: Self-pay | Admitting: Internal Medicine

## 2023-01-01 DIAGNOSIS — K5900 Constipation, unspecified: Secondary | ICD-10-CM

## 2023-01-01 LAB — CBC
HCT: 39.5 % (ref 35.0–45.0)
Hemoglobin: 12.9 g/dL (ref 11.7–15.5)
MCH: 31.2 pg (ref 27.0–33.0)
MCHC: 32.7 g/dL (ref 32.0–36.0)
MCV: 95.6 fL (ref 80.0–100.0)
MPV: 10.6 fL (ref 7.5–12.5)
Platelets: 210 10*3/uL (ref 140–400)
RBC: 4.13 10*6/uL (ref 3.80–5.10)
RDW: 12.1 % (ref 11.0–15.0)
WBC: 6.4 10*3/uL (ref 3.8–10.8)

## 2023-01-01 LAB — HEMOGLOBIN A1C
Hgb A1c MFr Bld: 5.3 %{Hb} (ref <5.7)
Mean Plasma Glucose: 105 mg/dL
eAG (mmol/L): 5.8 mmol/L

## 2023-01-01 LAB — COMPLETE METABOLIC PANEL WITH GFR
AG Ratio: 1.5 (calc) (ref 1.0–2.5)
ALT: 11 U/L (ref 6–29)
AST: 21 U/L (ref 10–30)
Albumin: 4.1 g/dL (ref 3.6–5.1)
Alkaline phosphatase (APISO): 39 U/L (ref 31–125)
BUN: 21 mg/dL (ref 7–25)
CO2: 21 mmol/L (ref 20–32)
Calcium: 8.8 mg/dL (ref 8.6–10.2)
Chloride: 107 mmol/L (ref 98–110)
Creat: 0.71 mg/dL (ref 0.50–0.99)
Globulin: 2.8 g/dL (ref 1.9–3.7)
Glucose, Bld: 98 mg/dL (ref 65–139)
Potassium: 4.1 mmol/L (ref 3.5–5.3)
Sodium: 138 mmol/L (ref 135–146)
Total Bilirubin: 0.2 mg/dL (ref 0.2–1.2)
Total Protein: 6.9 g/dL (ref 6.1–8.1)
eGFR: 107 mL/min/{1.73_m2} (ref >=60)

## 2023-01-01 LAB — LIPID PANEL
Cholesterol: 172 mg/dL (ref <200)
HDL: 68 mg/dL (ref >=50)
LDL Cholesterol (Calc): 87 mg/dL
Non-HDL Cholesterol (Calc): 104 mg/dL (ref <130)
Total CHOL/HDL Ratio: 2.5 (calc) (ref <5.0)
Triglycerides: 78 mg/dL (ref <150)

## 2023-01-01 LAB — PROGESTERONE: Progesterone: 21.1 ng/mL

## 2023-01-01 LAB — ESTROGENS, TOTAL: Estrogen: 217 pg/mL

## 2023-01-01 LAB — TSH: TSH: 1.03 m[IU]/L

## 2023-01-01 LAB — TESTOSTERONE, FREE: TESTOSTERONE FREE: 0.8 pg/mL (ref 0.2–5.0)

## 2023-01-01 LAB — SEDIMENTATION RATE: Sed Rate: 11 mm/h (ref 0–20)

## 2023-01-01 NOTE — Addendum Note (Signed)
Addended by: Judd Gaudier on: 01/01/2023 10:29 AM   Modules accepted: Orders

## 2023-01-01 NOTE — Addendum Note (Signed)
Addended by: Judd Gaudier on: 01/01/2023 09:57 AM   Modules accepted: Orders

## 2023-01-01 NOTE — Addendum Note (Signed)
Addended by: Judd Gaudier on: 01/01/2023 09:42 AM   Modules accepted: Orders

## 2023-01-06 LAB — OVA AND PARASITE EXAMINATION

## 2023-01-06 LAB — TEST AUTHORIZATION

## 2023-01-06 LAB — SALMONELLA AND SHIGELLA,CULTURE
MICRO NUMBER:: 15730760
SPECIMEN QUALITY:: ADEQUATE

## 2023-01-07 ENCOUNTER — Encounter: Payer: Self-pay | Admitting: Internal Medicine

## 2023-01-07 LAB — OVA AND PARASITE EXAMINATION
CONCENTRATE RESULT:: NONE SEEN
MICRO NUMBER:: 15732300
SPECIMEN QUALITY:: ADEQUATE
TRICHROME RESULT:: NONE SEEN

## 2023-01-30 DIAGNOSIS — M2604 Mandibular hypoplasia: Secondary | ICD-10-CM | POA: Diagnosis not present

## 2023-01-30 DIAGNOSIS — M26212 Malocclusion, Angle's class II: Secondary | ICD-10-CM | POA: Diagnosis not present

## 2023-02-09 ENCOUNTER — Other Ambulatory Visit: Payer: Self-pay | Admitting: Internal Medicine

## 2023-02-10 NOTE — Telephone Encounter (Signed)
Requested medication (s) are due for refill today - yes  Requested medication (s) are on the active medication list -yes  Future visit scheduled -no  Last refill: 09/04/22 #30  Notes to clinic: non delegated Rx  Requested Prescriptions  Pending Prescriptions Disp Refills   LORazepam (ATIVAN) 0.5 MG tablet [Pharmacy Med Name: LORAZEPAM 0.5 MG TABLET] 30 tablet 0    Sig: TAKE 1 TABLET BY MOUTH EVERY DAY AS NEEDED FOR ANXIETY     Not Delegated - Psychiatry: Anxiolytics/Hypnotics 2 Failed - 02/10/2023  8:32 AM      Failed - This refill cannot be delegated      Failed - Urine Drug Screen completed in last 360 days      Passed - Patient is not pregnant      Passed - Valid encounter within last 6 months    Recent Outpatient Visits           1 month ago Encounter for general adult medical examination with abnormal findings   [place] [facility] [place], [place], NP   9 months ago Acute non-recurrent frontal sinusitis   [place] [facility] [place], Netta Neat, DO   9 months ago Attention deficit hyperactivity disorder (ADHD), combined type   [place] [facility] [place], Salvadore [place], NP   1 year ago Encounter for general adult medical examination with abnormal findings   [place] [facility] [place], Salvadore [place], NP   2 years ago GAD (generalized anxiety disorder)   [place] [facility] [place], Salvadore [place], NP                 Requested Prescriptions  Pending Prescriptions Disp Refills   LORazepam (ATIVAN) 0.5 MG tablet [Pharmacy Med Name: LORAZEPAM 0.5 MG TABLET] 30 tablet 0    Sig: TAKE 1 TABLET BY MOUTH EVERY DAY AS NEEDED FOR ANXIETY     Not Delegated - Psychiatry: Anxiolytics/Hypnotics 2 Failed - 02/10/2023  8:32 AM      Failed - This refill cannot be delegated      Failed - Urine Drug Screen completed in last 360 days      Passed - Patient is not pregnant      Passed - Valid  encounter within last 6 months    Recent Outpatient Visits           1 month ago Encounter for general adult medical examination with abnormal findings   [place] [facility] [place], Salvadore [place], NP   9 months ago Acute non-recurrent frontal sinusitis   [place] [facility] Smit[place] Cords, DO   9 months ago Attention deficit hyperactivity disorder (ADHD), combined type   [place] [facility] [place], Salvadore Oxford, NP   1 year ago Encounter for general adult medical examination with abnormal findings   [place] [facility] [place], Salvadore Oxford, NP   2 years ago GAD (generalized anxiety disorder)   [place] [facility] [place], Salvadore Oxford, NP

## 2023-02-14 ENCOUNTER — Other Ambulatory Visit: Payer: Self-pay | Admitting: Internal Medicine

## 2023-02-19 NOTE — Telephone Encounter (Signed)
 Requested Prescriptions  Pending Prescriptions Disp Refills   busPIRone  (BUSPAR ) 10 MG tablet [Pharmacy Med Name: BUSPIRONE  HCL 10 MG TABLET] 180 tablet 0    Sig: TAKE 1 TABLET BY MOUTH TWICE A DAY     Psychiatry: Anxiolytics/Hypnotics - Non-controlled Passed - 02/19/2023 11:27 AM      Passed - Valid encounter within last 12 months    Recent Outpatient Visits           1 month ago Encounter for general adult medical examination with abnormal findings   [place] [facility] [place], Angeline ORN, NP   9 months ago Acute non-recurrent frontal sinusitis   [place] [facility] [place] Marsa PARAS, DO   9 months ago Attention deficit hyperactivity disorder (ADHD), combined type   [place] [facility] [place], Angeline ORN, NP   1 year ago Encounter for general adult medical examination with abnormal findings   [place] [facility] [place], Angeline ORN, NP   2 years ago GAD (generalized anxiety disorder)   [place] [facility] [place], Angeline ORN, NP

## 2023-03-03 DIAGNOSIS — R5383 Other fatigue: Secondary | ICD-10-CM | POA: Diagnosis not present

## 2023-03-03 DIAGNOSIS — E663 Overweight: Secondary | ICD-10-CM | POA: Diagnosis not present

## 2023-03-03 DIAGNOSIS — F5109 Other insomnia not due to a substance or known physiological condition: Secondary | ICD-10-CM | POA: Diagnosis not present

## 2023-03-03 DIAGNOSIS — Z6829 Body mass index (BMI) 29.0-29.9, adult: Secondary | ICD-10-CM | POA: Diagnosis not present

## 2023-04-01 ENCOUNTER — Other Ambulatory Visit: Payer: Self-pay | Admitting: Family Medicine

## 2023-04-01 DIAGNOSIS — Z20828 Contact with and (suspected) exposure to other viral communicable diseases: Secondary | ICD-10-CM

## 2023-04-01 MED ORDER — OSELTAMIVIR PHOSPHATE 75 MG PO CAPS: 75.0000 mg | ORAL_CAPSULE | Freq: Every day | ORAL | 0 refills | Status: DC

## 2023-05-07 ENCOUNTER — Other Ambulatory Visit: Payer: Self-pay | Admitting: Internal Medicine

## 2023-05-07 NOTE — Telephone Encounter (Signed)
 Requested medication (s) are due for refill today- yes  Requested medication (s) are on the active medication list -yes  Future visit scheduled -no  Last refill: 02/12/23 #30  Notes to clinic: non delegated Rx  Requested Prescriptions  Pending Prescriptions Disp Refills   LORazepam (ATIVAN) 0.5 MG tablet [Pharmacy Med Name: LORAZEPAM 0.5 MG TABLET] 30 tablet 0    Sig: TAKE 1 TABLET BY MOUTH EVERY DAY AS NEEDED FOR ANXIETY     Not Delegated - Psychiatry: Anxiolytics/Hypnotics 2 Failed - 05/07/2023 11:39 AM      Failed - This refill cannot be delegated      Failed - Urine Drug Screen completed in last 360 days      Passed - Patient is not pregnant      Passed - Valid encounter within last 6 months    Recent Outpatient Visits           4 months ago Encounter for general adult medical examination with abnormal findings   [place] [facility] [place], [place], NP   11 months ago Acute non-recurrent frontal sinusitis   [place] [facility] [place], Netta Neat, DO   1 year ago Attention deficit hyperactivity disorder (ADHD), combined type   [place] [facility] [place], Salvadore [place], NP   1 year ago Encounter for general adult medical examination with abnormal findings   [place] [facility] [place], Salvadore Oxford, NP   2 years ago GAD (generalized anxiety disorder)   [place] [facility] [place], Salvadore Oxford, NP                 Requested Prescriptions  Pending Prescriptions Disp Refills   LORazepam (ATIVAN) 0.5 MG tablet [Pharmacy Med Name: LORAZEPAM 0.5 MG TABLET] 30 tablet 0    Sig: TAKE 1 TABLET BY MOUTH EVERY DAY AS NEEDED FOR ANXIETY     Not Delegated - Psychiatry: Anxiolytics/Hypnotics 2 Failed - 05/07/2023 11:39 AM      Failed - This refill cannot be delegated      Failed - Urine Drug Screen completed in last 360 days      Passed - Patient is not pregnant      Passed - Valid  encounter within last 6 months    Recent Outpatient Visits           4 months ago Encounter for general adult medical examination with abnormal findings   [place] [facility] [place], [place], NP   11 months ago Acute non-recurrent frontal sinusitis   [place] [facility] Smitty Cords, DO   1 year ago Attention deficit hyperactivity disorder (ADHD), combined type   [place] [facility] [place], Salvadore Oxford, NP   1 year ago Encounter for general adult medical examination with abnormal findings   [place] [facility] [place], Salvadore Oxford, NP   2 years ago GAD (generalized anxiety disorder)   [place] [facility] [place], Salvadore [place], NP

## 2023-06-02 DIAGNOSIS — F418 Other specified anxiety disorders: Secondary | ICD-10-CM | POA: Diagnosis not present

## 2023-06-15 DIAGNOSIS — F418 Other specified anxiety disorders: Secondary | ICD-10-CM | POA: Diagnosis not present

## 2023-06-23 DIAGNOSIS — F418 Other specified anxiety disorders: Secondary | ICD-10-CM | POA: Diagnosis not present

## 2023-07-02 DIAGNOSIS — F418 Other specified anxiety disorders: Secondary | ICD-10-CM | POA: Diagnosis not present

## 2023-07-09 DIAGNOSIS — F418 Other specified anxiety disorders: Secondary | ICD-10-CM | POA: Diagnosis not present

## 2023-07-23 DIAGNOSIS — F418 Other specified anxiety disorders: Secondary | ICD-10-CM | POA: Diagnosis not present

## 2023-07-28 DIAGNOSIS — B351 Tinea unguium: Secondary | ICD-10-CM | POA: Diagnosis not present

## 2023-07-28 DIAGNOSIS — Z872 Personal history of diseases of the skin and subcutaneous tissue: Secondary | ICD-10-CM | POA: Diagnosis not present

## 2023-07-28 DIAGNOSIS — L309 Dermatitis, unspecified: Secondary | ICD-10-CM | POA: Diagnosis not present

## 2023-08-07 ENCOUNTER — Other Ambulatory Visit: Payer: Self-pay

## 2023-08-07 ENCOUNTER — Ambulatory Visit: Payer: Self-pay

## 2023-08-07 ENCOUNTER — Ambulatory Visit (INDEPENDENT_AMBULATORY_CARE_PROVIDER_SITE_OTHER): Admitting: Internal Medicine

## 2023-08-07 ENCOUNTER — Other Ambulatory Visit: Payer: Self-pay | Admitting: Internal Medicine

## 2023-08-07 ENCOUNTER — Ambulatory Visit: Admitting: Internal Medicine

## 2023-08-07 ENCOUNTER — Encounter: Payer: Self-pay | Admitting: Internal Medicine

## 2023-08-07 VITALS — BP 112/68 | Ht 66.0 in | Wt 165.6 lb

## 2023-08-07 DIAGNOSIS — N6012 Diffuse cystic mastopathy of left breast: Secondary | ICD-10-CM | POA: Diagnosis not present

## 2023-08-07 DIAGNOSIS — K5900 Constipation, unspecified: Secondary | ICD-10-CM

## 2023-08-07 DIAGNOSIS — N644 Mastodynia: Secondary | ICD-10-CM | POA: Diagnosis not present

## 2023-08-07 DIAGNOSIS — K5903 Drug induced constipation: Secondary | ICD-10-CM

## 2023-08-07 MED ORDER — LINACLOTIDE 72 MCG PO CAPS: 72.0000 ug | ORAL_CAPSULE | Freq: Every day | ORAL | 1 refills | Status: DC

## 2023-08-07 NOTE — Patient Instructions (Signed)
 Breast Tenderness Breast tenderness is a common problem for women of all ages, but may also occur in men. Breast tenderness has many possible causes, including hormone changes, infections, taking certain medicines, and caffeine intake. In women, the pain usually comes and goes with the menstrual cycle, but it can also be constant. Breast tenderness may range from mild discomfort to severe pain. You may have tests, such as a mammogram or an ultrasound, to check for any unusual findings. Having breast tenderness usually does not mean that you have breast cancer. Follow these instructions at home: Managing pain and discomfort  If directed, put ice on the painful area. To do this: Put ice in a plastic bag. Place a towel between your skin and the bag. Leave the ice on for 20 minutes, 2-3 times a day. If your skin turns bright red, remove the ice right away to prevent skin damage. The risk of skin damage is higher if you cannot feel pain, heat, or cold. Wear a supportive bra or chest support: During exercise. While sleeping, if your breasts are very tender. Medicines Take over-the-counter and prescription medicines only as told by your health care provider. If the cause of your pain is an infection, you may be prescribed an antibiotic medicine. If you were prescribed antibiotics, take them as told by your health care provider. Do not stop using the antibiotic even if you start to feel better. Eating and drinking Decrease the amount of caffeine in your diet. Instead, drink more water and choose caffeine-free drinks. Your health care provider may recommend that you lessen the amount of fat in your diet. You can do this by: Limiting fried foods. Cooking foods using methods such as baking, boiling, grilling, and broiling. General instructions  Keep a log of the days and times when your breasts are most tender. Ask your health care provider how to do breast exams at home. This will help you notice if  you have an unusual growth or lump. Keep all follow-up visits. Contact a health care provider if: Any part of your breast is hard, red, and hot to the touch. This may be a sign of infection. You are a woman and have a new or painful lump in your breast that remains after your menstrual period ends. You are not breastfeeding and you have fluid, especially blood or pus, coming out of your nipples. You have a fever. Your pain does not improve or it gets worse. Your pain is interfering with your daily activities. Summary Breast tenderness may range from mild discomfort to severe pain. Breast tenderness has many possible causes, including hormone changes, infections, taking certain medicines, and caffeine intake. It can be treated with ice, wearing a supportive bra or chest support, and medicines. Make changes to your diet as told by your health care provider. This information is not intended to replace advice given to you by your health care provider. Make sure you discuss any questions you have with your health care provider. Document Revised: 04/17/2021 Document Reviewed: 04/17/2021 Elsevier Patient Education  2024 [employer].

## 2023-08-07 NOTE — Telephone Encounter (Signed)
 FYI Only or Action Required?: FYI only for provider.  Patient was last seen in primary care on 12/26/2022 by Carollynn Cirri, NP. Called Nurse Triage reporting Breast Pain. Symptoms began about a month ago. Interventions attempted: Nothing. Symptoms are: unchanged.  Triage Disposition: See PCP Within 2 Weeks  Patient/caregiver understands and will follow disposition?: yes     Copied from CRM [id number]. Topic: Clinical - Red Word Triage >> Aug 07, 2023  9:08 AM Laura Dodson wrote: Red Word that prompted transfer to Nurse Triage: Pain in left breast, ultrasound was clear. Patient is still experiencing pain in left breast and under her arm. Comes and goes. Pain is getting sharper. does not feel she has pulled a muscle. been going on for 4 weeks. Callback number [phone] Reason for Disposition  [1] Breast pain AND [2] cause is not known  Answer Assessment - Initial Assessment Questions 1. SYMPTOM: What's the main symptom you're concerned about?  (e.g., lump, pain, rash, nipple discharge)     pain 2. LOCATION: Where is the pain   located?     Left breast 3 o clock//axilla 3. ONSET: When did pain   start?     4 weeks  4. PRIOR HISTORY: Do you have any history of prior problems with your breasts? (e.g., lumps, cancer, fibrocystic breast disease)     *No Answer* 5. CAUSE: What do you think is causing this symptom?    Thought had pulled something but has not walked with weighted vest when walking 6. OTHER SYMPTOMS: Do you have any other symptoms? (e.g., fever, breast pain, redness or rash, nipple discharge)     Sharp 8/10 lasts comes and goes several times then will go away  Protocols used: Breast Symptoms-A-AH

## 2023-08-07 NOTE — Telephone Encounter (Signed)
 Will discuss at upcoming appointment

## 2023-08-07 NOTE — Progress Notes (Signed)
 Subjective:    Patient ID: Laura Dodson, female    DOB: 13-Dec-1980, 43 y.o.   MRN: [id number]  HPI  Discussed the use of AI scribe software for clinical note transcription with the patient, who gave verbal consent to proceed.  Laura Dodson is a 43 year old female with fibrocystic breast disease who presents with left breast pain.  She has been experiencing sharp, localized pain in her left breast for the past month and a half. The pain does not radiate and is not exacerbated by touch or movement, but she feels discomfort when taking deep breaths. The pain has been persistent and has disrupted her sleep.  About a week ago, she developed a rash in the same area of the left breast where she experiences pain. It was diagnosed as contact dermatitis by her dermatologist. The rash was itchy, and although it has mostly resolved, the outline is still visible.  Last year, after a mammogram, she was informed of possible fibrocystic breast changes. An ultrasound was performed at that time, but it was limited to a specific area. Recently, she underwent a HER scan, which included the armpits and full breasts, and the results were negative for any abnormalities.  She mentions experiencing a random cough but no shortness of breath, shoulder pain, or axillary pain. She is right-handed and does not engage in heavy lifting or repetitive activities with her left side.  She has recently started taking Zepbound and reports experiencing severe constipation, which she describes as 'horrible'. She has adjusted her caffeine intake by mixing regular and decaf coffee, limiting herself to one cup per day.  She has failed MiraLAX, fiber supplements, prunes and probiotics in the past.       Review of Systems     Past Medical History:  Diagnosis Date   Anxiety     Current Outpatient Medications  Medication Sig Dispense Refill   oseltamivir  (TAMIFLU ) 75 MG capsule Take 1 capsule (75 mg total) by mouth daily.  For 10 days. If develop symptoms change dosing to twice daily to finish course 10 capsule 0   Ascorbic Acid (VITAMIN C) 100 MG tablet Take 100 mg by mouth daily.     cholecalciferol (VITAMIN D3) 25 MCG (1000 UNIT) tablet Take 1,000 Units by mouth daily.     ELDERBERRY PO Take by mouth.     ipratropium (ATROVENT ) 0.06 % nasal spray PLACE 2 SPRAYS INTO BOTH NOSTRILS 4 (FOUR) TIMES DAILY. FOR UP TO 5-7 DAYS THEN STOP. 15 mL 1   LORazepam  (ATIVAN ) 0.5 MG tablet TAKE 1 TABLET BY MOUTH EVERY DAY AS NEEDED FOR ANXIETY 30 tablet 0   valACYclovir  (VALTREX ) 500 MG tablet TAKE 1 TABLET BY MOUTH TWICE A DAY AS NEEDED 180 tablet 1   Zinc  100 MG TABS Take by mouth.     No current facility-administered medications for this visit.    Allergies  Allergen Reactions   Buspar  [Buspirone ] Nausea Only    Family History  Problem Relation Age of Onset   Healthy Mother    Squamous cell carcinoma Mother    Atrial fibrillation Father    COPD Father    Heart failure Father    Healthy Brother    Healthy Daughter    Squamous cell carcinoma Maternal Aunt    Melanoma Maternal Grandmother    Lung cancer Maternal Grandmother    Cancer Paternal Grandfather    Healthy Brother     Social History   Socioeconomic History   Marital status:  Married    Spouse name: Not on file   Number of children: Not on file   Years of education: Not on file   Highest education level: Associate degree: occupational, Scientist, product/process development, or vocational program  Occupational History   Not on file  Tobacco Use   Smoking status: Former    Current packs/day: 0.00    Average packs/day: 0.5 packs/day for 15.0 years (7.5 ttl pk-yrs)    Types: Cigarettes    Start date: 68    Quit date: 2012    Years since quitting: 13.4   Smokeless tobacco: Never  Vaping Use   Vaping status: Never Used  Substance and Sexual Activity   Alcohol use: Yes    Comment: 2-3   Drug use: No   Sexual activity: Not on file  Other Topics Concern   Not on file   Social History Narrative   Not on file   Social Drivers of Health   Financial Resource Strain: Not on file  Food Insecurity: Not on file  Transportation Needs: Not on file  Physical Activity: Not on file  Stress: No Stress Concern Present (05/25/2017)   [employer] of Occupational Health - Occupational Stress Questionnaire    Feeling of Stress : Not at all  Social Connections: Unknown (07/02/2021)   [facility]   Social Network    Social Network: Not on file  Intimate Partner Violence: Unknown (05/24/2021)   Received from [facility]   HITS    Physically Hurt: Not on file    Insult or Talk Down To: Not on file    Threaten Physical Harm: Not on file    Scream or Curse: Not on file     Constitutional: Denies fever, malaise, fatigue, headache or abrupt weight changes.  HEENT: Denies eye pain, eye redness, ear pain, ringing in the ears, wax buildup, runny nose, nasal congestion, bloody nose, or sore throat. Respiratory: Denies difficulty breathing, shortness of breath, cough or sputum production.   Cardiovascular: Denies chest pain, chest tightness, palpitations or swelling in the hands or feet.  Gastrointestinal: Patient reports constipation.  Denies abdominal pain, bloating, diarrhea or blood in the stool.  GU: Denies urgency, frequency, pain with urination, burning sensation, blood in urine, odor or discharge. Musculoskeletal: Denies decrease in range of motion, difficulty with gait, muscle pain or joint pain and swelling.  Skin: Patient reports left breast pain.  Denies redness, rashes, lesions or ulcercations.  Neurological: Patient reports inattention.  Denies dizziness, difficulty with memory, difficulty with speech or problems with balance and coordination.  Psych: Patient is a history of anxiety.  Denies depression, SI/HI.  No other specific complaints in a complete review of systems (except as listed in HPI above).  Objective:   Physical Exam  BP  112/68 (BP Location: Right Arm, Patient Position: Sitting, Cuff Size: Normal)   Ht 5' 6 (1.676 m)   Wt 165 lb 9.6 oz (75.1 kg)   LMP 07/27/2023 (Exact Date)   BMI 26.73 kg/m    Wt Readings from Last 3 Encounters:  12/26/22 175 lb (79.4 kg)  05/16/22 176 lb (79.8 kg)  05/01/22 176 lb (79.8 kg)    General: Appears her stated age, overweight in NAD. Breast: Symmetrical.  No changes noted in the skin of the left breast.  No nipple discharge.  Fibrocystic changes noted in the left breast.  No axillary adenopathy noted. Cardiovascular: Normal rate. Pulmonary/Chest: Normal effort. Musculoskeletal: No difficulty with gait.  Neurological: Alert and oriented.  BMET    Component Value Date/Time   NA 138 12/26/2022 1532   K 4.1 12/26/2022 1532   CL 107 12/26/2022 1532   CO2 21 12/26/2022 1532   GLUCOSE 98 12/26/2022 1532   BUN 21 12/26/2022 1532   CREATININE 0.71 12/26/2022 1532   CALCIUM 8.8 12/26/2022 1532   GFRNONAA 109 09/16/2019 1028   GFRAA 126 09/16/2019 1028    Lipid Panel     Component Value Date/Time   CHOL 172 12/26/2022 1532   TRIG 78 12/26/2022 1532   HDL 68 12/26/2022 1532   CHOLHDL 2.5 12/26/2022 1532   LDLCALC 87 12/26/2022 1532    CBC    Component Value Date/Time   WBC 6.4 12/26/2022 1532   RBC 4.13 12/26/2022 1532   HGB 12.9 12/26/2022 1532   HCT 39.5 12/26/2022 1532   PLT 210 12/26/2022 1532   MCV 95.6 12/26/2022 1532   MCH 31.2 12/26/2022 1532   MCHC 32.7 12/26/2022 1532   RDW 12.1 12/26/2022 1532   LYMPHSABS 1,780 09/16/2019 1028   EOSABS 60 09/16/2019 1028   BASOSABS 18 09/16/2019 1028    Hgb A1C Lab Results  Component Value Date   HGBA1C 5.3 12/26/2022           Assessment & Plan:   Assessment and Plan    Left breast pain Intermittent sharp pain for 1.5 months, not linked to palpation or movement. Negative HER scan and ultrasound. Possible fibrocystic changes or musculoskeletal pain. Rash unrelated. - Avoid excessive  palpation. - Consider chest X-ray if symptoms persist or worsen. - Limit caffeine and chocolate intake.  Constipation Severe constipation likely due to Zepbound, with bloating and gas. No nausea or vomiting. - Discuss Zepbound side effects with prescribing physician. - Consider dietary modifications, such as increased fiber intake.  - Will trial Linzess 72 mcg daily -Stool ova and parasite and Salmonella negative, will repeat stool culture      RTC in 5 months for your annual exam Helayne Lo, NP

## 2023-08-10 NOTE — Telephone Encounter (Signed)
 Requested medications are due for refill today.  See note  Requested medications are on the active medications list.  yes  Last refill. 08/07/2023 #90 1 rf  Future visit scheduled.   no  Notes to clinic.  Pharmacy comment: Alternative Requested:NOT COVERED BY INS.    Requested Prescriptions  Pending Prescriptions Disp Refills   LINZESS  72 MCG capsule [Pharmacy Med Name: LINZESS  72 MCG CAPSULE] 90 capsule 1    Sig: TAKE 1 CAPSULE BY MOUTH DAILY BEFORE BREAKFAST.     Gastroenterology: Irritable Bowel Syndrome Failed - 08/10/2023  5:55 PM      Failed - Valid encounter within last 12 months    Recent Outpatient Visits           3 days ago Breast pain, left   [place] [facility] [place], Angeline [place]N, [place]

## 2023-08-11 ENCOUNTER — Encounter: Payer: Self-pay | Admitting: Internal Medicine

## 2023-08-13 DIAGNOSIS — F418 Other specified anxiety disorders: Secondary | ICD-10-CM | POA: Diagnosis not present

## 2023-08-26 ENCOUNTER — Other Ambulatory Visit: Payer: Self-pay | Admitting: Internal Medicine

## 2023-08-26 DIAGNOSIS — N644 Mastodynia: Secondary | ICD-10-CM | POA: Diagnosis not present

## 2023-08-27 DIAGNOSIS — F418 Other specified anxiety disorders: Secondary | ICD-10-CM | POA: Diagnosis not present

## 2023-08-28 NOTE — Telephone Encounter (Signed)
 Requested medications are due for refill today.  yes  Requested medications are on the active medications list.  yes  Last refill. 05/07/2023 #30 0 rf  Future visit scheduled.   no  Notes to clinic.  Refill not delegated.    Requested Prescriptions  Pending Prescriptions Disp Refills   LORazepam  (ATIVAN ) 0.5 MG tablet [Pharmacy Med Name: LORAZEPAM  0.5 MG TABLET] 30 tablet 0    Sig: TAKE 1 TABLET BY MOUTH EVERY DAY AS NEEDED FOR ANXIETY     Not Delegated - Psychiatry: Anxiolytics/Hypnotics 2 Failed - 08/28/2023 11:45 AM      Failed - This refill cannot be delegated      Failed - Urine Drug Screen completed in last 360 days      Failed - Valid encounter within last 6 months    Recent Outpatient Visits           3 weeks ago Breast pain, left   [place] [facility] [place], Angeline [place], [place]              Passed - Patient is not pregnant

## 2023-09-24 DIAGNOSIS — F418 Other specified anxiety disorders: Secondary | ICD-10-CM | POA: Diagnosis not present

## 2023-09-30 ENCOUNTER — Ambulatory Visit: Payer: Self-pay

## 2023-09-30 ENCOUNTER — Ambulatory Visit

## 2023-09-30 VITALS — BP 115/70 | HR 69 | Ht 66.0 in | Wt 163.5 lb

## 2023-09-30 DIAGNOSIS — R590 Localized enlarged lymph nodes: Secondary | ICD-10-CM | POA: Diagnosis not present

## 2023-09-30 NOTE — Progress Notes (Signed)
     Acute Patient Visit  Physician: Raeden Schippers A Emmalise Huard, MD  Patient: Laura Dodson MRN: [id number] DOB: Jul 17, 1980 PCP: Antonette Angeline ORN, NP     Subjective:   Chief Complaint  Patient presents with   Medical Management of Chronic Issues    Constant drainage in back of throat . Swollen lymph node      HPI: The patient is a 43 y.o. female who presents today for:   Discussed the use of AI scribe software for clinical note transcription with the patient, who gave verbal consent to proceed.  History of Present Illness   Patient with onset of what she felt like was a swollen lymph node in the left anterior portion of her neck.  Overall feeling well she did travel more recently and had some nasal drainage but this is resolved for the most part.  No fever chills or other symptoms no difficulty swallowing.    ROS:   As noted in the HPI    ASSESMENT/PLAN:  No diagnosis found.  No orders of the defined types were placed in this encounter.   Assessment and Plan    Physical exam reassuring.  Discussed natural course of lymphadenopathy.  Suspect transient viral infection which is now resolving.  We did discuss overall weight she is on Zepbound.  Patient is working with trainer and will discuss this further with her primary care provider    OBJECTIVE: Vitals:   09/30/23 1502  BP: 115/70  Pulse: 69  SpO2: 98%  Weight: 163 lb 8 oz (74.2 kg)  Height: 5' 6 (1.676 m)    Body mass index is 26.39 kg/m.   Physical Exam Vitals reviewed.  Constitutional:      Appearance: Normal appearance. Well-developed with normal weight.  Cardiovascular:     Rate and Rhythm: Normal rate and regular rhythm. Normal heart sounds. Normal peripheral pulses Pulmonary:     Normal breath sounds with normal effort Skin:    General: Skin is warm and dry without noticeable rash. Neurological:     General: No focal deficit present.  Psychiatric:        Mood and Affect: Mood, behavior and  cognition normal       Allergies Patient is allergic to buspar  [buspirone ].  Past Medical History Patient  has a past medical history of Anxiety.  Surgical History Patient  has no past surgical history on file.  Family History Pateint's family history includes Atrial fibrillation in her father; COPD in her father; Cancer in her paternal grandfather; Healthy in her brother, brother, daughter, and mother; Heart failure in her father; Lung cancer in her maternal grandmother; Melanoma in her maternal grandmother; Squamous cell carcinoma in her maternal aunt and mother.  Social History Patient  reports that she quit smoking about 13 years ago. Her smoking use included cigarettes. She started smoking about 28 years ago. She has a 7.5 pack-year smoking history. She has never used smokeless tobacco. She reports current alcohol use. She reports that she does not use drugs.    09/30/2023

## 2023-09-30 NOTE — Telephone Encounter (Signed)
 FYI Only or Action Required?: FYI only for provider.  Patient was last seen in primary care on 08/07/2023 by Antonette Angeline ORN, NP.  Called Nurse Triage reporting Adenopathy.  Symptoms began unknown.  Interventions attempted: Nothing.  Symptoms are: unchanged.  Triage Disposition: See Physician Within 24 Hours  Patient/caregiver understands and will follow disposition?: Yes  Copied from CRM #[id number]. Topic: Clinical - Red Word Triage >> Sep 30, 2023 11:20 AM Ivette P wrote: [facility] that prompted transfer to Nurse Triage: swollen lyphnodes, getting drainage, uspdie underneath jaw very swollen Reason for Disposition  [1] Tender node in the neck AND [2] also has a sore throat AND [3] minimal/no runny nose or cough  Answer Assessment - Initial Assessment Questions 1. LOCATION: Where is the swollen node located? Is the matching node on the other side of the body also swollen?      Left lymph node under jaw 2. SIZE: How big is the node? (e.g., inches or centimeters; or compared to common objects such as pea, bean, marble, golf ball)      marble 3. ONSET: When did the swelling start?      I don't know 4. NECK NODES: Is there a sore throat, runny nose or other symptoms of a cold?      Can hear ear pop 5. GROIN OR ARMPIT NODES: Is there a sore, scratch, cut or painful red area on that arm or leg?      no 6. FEVER: Do you have a fever? If Yes, ask: What is it, how was it measured, and when did it start?      no 7. CAUSE: What do you think is causing the swollen lymph nodes?     unknown 8. OTHER SYMPTOMS: Do you have any other symptoms? (e.g., node is tender to touch, skin redness over node, weight changes)     denies 9. PREGNANCY: Is there any chance you are pregnant? When was your last menstrual period?     na  Protocols used: Lymph Nodes - Swollen-A-AH

## 2023-10-09 DIAGNOSIS — Z1231 Encounter for screening mammogram for malignant neoplasm of breast: Secondary | ICD-10-CM | POA: Diagnosis not present

## 2023-10-13 ENCOUNTER — Ambulatory Visit: Payer: Self-pay

## 2023-10-13 NOTE — Telephone Encounter (Signed)
 FYI Only or Action Required?: Action required by provider: update on patient condition. Appointment schedule for 8/27  Patient was last seen in primary care on 09/30/2023 by Zafirov, Clarissa A, MD.  Called Nurse Triage reporting Cough.  Symptoms began a week ago.  Interventions attempted: OTC medications: allergy meds and decogestants.  Symptoms are: unchanged.  Triage Disposition: Home Care  Patient/caregiver understands and will follow disposition?: Yes  Copied from CRM [id number]. Topic: Clinical - Red Word Triage >> Oct 13, 2023 11:47 AM Rosaria BRAVO wrote: Red Word that prompted transfer to Nurse Triage:  Horrible cough, worsening, kept her from sleeping.    ----------------------------------------------------------------------- From previous Reason for Contact - Scheduling: Patient/patient representative is calling to schedule an appointment. Refer to attachments for appointment information. Reason for Disposition  Cough with cold symptoms (e.g., runny nose, postnasal drip, throat clearing)  Answer Assessment - Initial Assessment Questions 1. ONSET: When did the cough begin?      Over a week ago 2. SEVERITY: How bad is the cough today?      Cough keeping patient up at night, describes as cough related to post nasal drip 3. SPUTUM: Describe the color of your sputum (e.g., none, dry cough; clear, white, yellow, green)     Clear to light green 4. HEMOPTYSIS: Are you coughing up any blood? If Yes, ask: How much? (e.g., flecks, streaks, tablespoons, etc.)     denies 5. DIFFICULTY BREATHING: Are you having difficulty breathing? If Yes, ask: How bad is it? (e.g., mild, moderate, severe)      denies 6. FEVER: Do you have a fever? If Yes, ask: What is your temperature, how was it measured, and when did it start?     denies 7. CARDIAC HISTORY: Do you have any history of heart disease? (e.g., heart attack, congestive heart failure)      denies 8. LUNG HISTORY:  Do you have any history of lung disease?  (e.g., pulmonary embolus, asthma, emphysema)     denies 9. PE RISK FACTORS: Do you have a history of blood clots? (or: recent major surgery, recent prolonged travel, bedridden)     denies 10. OTHER SYMPTOMS: Do you have any other symptoms? (e.g., runny nose, wheezing, chest pain)       Post nasal drip 11. PREGNANCY: Is there any chance you are pregnant? When was your last menstrual period?       N/A 12. TRAVEL: Have you traveled out of the country in the last month? (e.g., travel history, exposures)       denies  Protocols used: Cough - Acute Productive-A-AH

## 2023-10-14 ENCOUNTER — Ambulatory Visit (INDEPENDENT_AMBULATORY_CARE_PROVIDER_SITE_OTHER): Admitting: Family Medicine

## 2023-10-14 ENCOUNTER — Encounter: Payer: Self-pay | Admitting: Family Medicine

## 2023-10-14 VITALS — BP 112/68 | HR 63 | Ht 66.0 in | Wt 167.5 lb

## 2023-10-14 DIAGNOSIS — J0110 Acute frontal sinusitis, unspecified: Secondary | ICD-10-CM

## 2023-10-14 DIAGNOSIS — J9801 Acute bronchospasm: Secondary | ICD-10-CM | POA: Diagnosis not present

## 2023-10-14 MED ORDER — PREDNISONE 10 MG PO TABS: ORAL_TABLET | ORAL | 0 refills | Status: DC

## 2023-10-14 MED ORDER — ALBUTEROL SULFATE HFA 108 (90 BASE) MCG/ACT IN AERS: 2.0000 | INHALATION_SPRAY | Freq: Four times a day (QID) | RESPIRATORY_TRACT | 0 refills | Status: AC | PRN

## 2023-10-14 MED ORDER — HYDROCOD POLI-CHLORPHE POLI ER 10-8 MG/5ML PO SUER: 5.0000 mL | Freq: Two times a day (BID) | ORAL | 0 refills | Status: DC | PRN

## 2023-10-14 MED ORDER — AZITHROMYCIN 250 MG PO TABS: ORAL_TABLET | ORAL | 0 refills | Status: DC

## 2023-10-14 NOTE — Patient Instructions (Addendum)
 Thank you for coming to the office today.  We will cover for possible sinus infection or bronchitis - although I doubt it is bacterial at this point. This is just a precaution.  Start Azithromycin  Z pak (antibiotic) 2 tabs day 1, then 1 tab x 4 days, complete entire course even if improved  Start Prednisone  taper 6 days 6 > 5 > 4 > 3 > 2 > 1  -----------------  Take the Tussionex cough syrup as needed, caution sedation   Try Albuterol  rescue inhaler as needed for coughing spasm or coughing symptoms  Please schedule a Follow-up Appointment to: Return if symptoms worsen or fail to improve.  If you have any other questions or concerns, please feel free to call the office or send a message through MyChart. You may also schedule an earlier appointment if necessary.  Additionally, you may be receiving a survey about your experience at our office within a few days to 1 week by e-mail or mail. We value your feedback.  Marsa Officer, DO [facility], [place]

## 2023-10-14 NOTE — Progress Notes (Signed)
 Subjective:    Patient ID: Laura Dodson, female    DOB: [birth year], 43 y.o.   MRN: [id number]  Laura Dodson is a 43 y.o. female presenting on 10/14/2023 for Cough  Patient presents for a same day appointment.  PCP Angeline Laura, FNP   HPI  Discussed the use of AI scribe software for clinical note transcription with the patient, who gave verbal consent to proceed.  History of Present Illness   Laura Dodson is a 43 year old female who presents with persistent cough and drainage following a recent beach trip.  URI / Sinusitis vs Bronchitis / Lingering Cough - Persistent cough and drainage since returning from a beach trip on September 12, 2023 - Swollen lymph nodes present since onset of symptoms - Already seen by provider here for this and given conservative therapy - No runny nose or fever - Able to breathe well through nose - Feels clammy and 'off' at times  Cough characteristics - Complete loss of voice last Thursday and Friday, with return of voice on Saturday - Persistent nighttime cough, improves during the day but worsens at night - Cough described as deep and tight after prolonged coughing spells - Cough is productive, often expectorating some mucus in evening with cough  Symptom trajectory and family exposure - Symptoms have persisted and worsened since onset - Family members, including husband and children, have not experienced similar symptoms except for three-year-old with a mild, short-lived cough - No prior similar illnesses; typically does not get sick  Response to treatments - Tried allergy medication (Zyrtec) without symptom relief - Used over-the-counter cough medications without improvement - Used old cough medicine Tussionex, which provided relief and improved sleep, but ran out and symptoms returned - Has not used previously prescribed nasal spray recently - Not recently but Previously tolerated prednisone  without adverse effects         08/07/2023    11:02 AM 05/01/2022    9:32 AM 12/12/2021    2:54 PM  Depression screen PHQ 2/9  Decreased Interest  0 0  Down, Depressed, Hopeless  2 0  PHQ - 2 Score  2 0  Altered sleeping  2 0  Tired, decreased energy  3 0  Change in appetite  2 0  Feeling bad or failure about yourself   0 0  Trouble concentrating  3 0  Moving slowly or fidgety/restless  0 0  Suicidal thoughts  0 0  PHQ-9 Score  12 0  Difficult doing work/chores Not difficult at all Very difficult Not difficult at all       08/07/2023   11:02 AM 05/01/2022    9:33 AM 12/11/2020    9:19 AM 09/16/2019   10:22 AM  GAD 7 : Generalized Anxiety Score  Nervous, Anxious, on Edge  2 1 0  Control/stop worrying  2 1 0  Worry too much - different things  2 1 0  Trouble relaxing  2 1 0  Restless  2 0 0  Easily annoyed or irritable  2 1 0  Afraid - awful might happen  2 0 0  Total GAD 7 Score  14 5 0  Anxiety Difficulty Not difficult at all Very difficult Somewhat difficult Not difficult at all    Social History   Tobacco Use   Smoking status: Former    Current packs/day: 0.00    Average packs/day: 0.5 packs/day for 15.0 years (7.5 ttl pk-yrs)    Types: Cigarettes    Start date:  1997    Quit date: 2012    Years since quitting: 13.6   Smokeless tobacco: Never  Vaping Use   Vaping status: Never Used  Substance Use Topics   Alcohol use: Yes    Comment: 2-3   Drug use: No    Review of Systems Per HPI unless specifically indicated above     Objective:    BP 112/68 (BP Location: Left Arm, Patient Position: Sitting, Cuff Size: Normal)   Pulse 63   Ht 5' 6 (1.676 m)   Wt 167 lb 8 oz (76 kg)   SpO2 100%   BMI 27.04 kg/m   Wt Readings from Last 3 Encounters:  10/14/23 167 lb 8 oz (76 kg)  09/30/23 163 lb 8 oz (74.2 kg)  08/07/23 165 lb 9.6 oz (75.1 kg)    Physical Exam Vitals and nursing note reviewed.  Constitutional:      General: She is not in acute distress.    Appearance: She is well-developed. She is not  diaphoretic.     Comments: Well-appearing, comfortable, cooperative  HENT:     Head: Normocephalic and atraumatic.     Right Ear: Ear canal and external ear normal. There is no impacted cerumen.     Left Ear: Ear canal and external ear normal. There is no impacted cerumen.     Ears:     Comments: Bilateral TM effusion clear    Mouth/Throat:     Mouth: Mucous membranes are moist.     Pharynx: Posterior oropharyngeal erythema (posterior oropharynx only. no other evidence of ulceration or exudate) present. No oropharyngeal exudate.  Eyes:     General:        Right eye: No discharge.        Left eye: No discharge.     Conjunctiva/sclera: Conjunctivae normal.  Neck:     Thyroid : No thyromegaly.  Cardiovascular:     Rate and Rhythm: Normal rate and regular rhythm.     Heart sounds: Normal heart sounds. No murmur heard. Pulmonary:     Effort: Pulmonary effort is normal. No respiratory distress.     Breath sounds: Normal breath sounds. No wheezing or rales.     Comments: No cough Musculoskeletal:        General: Normal range of motion.     Cervical back: Normal range of motion and neck supple.  Lymphadenopathy:     Cervical: No cervical adenopathy.  Skin:    General: Skin is warm and dry.     Findings: No erythema or rash.  Neurological:     Mental Status: She is alert and oriented to person, place, and time.  Psychiatric:        Behavior: Behavior normal.     Comments: Well groomed, good eye contact, normal speech and thoughts     Results for orders placed or performed in visit on 01/01/23  Ova and parasite examination   Collection Time: 01/01/23 10:51 AM   Specimen: Stool  Result Value Ref Range   MICRO NUMBER: [id number]    SPECIMEN QUALITY: Adequate    Source STOOL    STATUS: FINAL    CONCENTRATE RESULT: No ova or parasites seen    TRICHROME RESULT: No ova or parasites seen    COMMENT:      Routine Ova and Parasite exam may not detect some parasites that occasionally  cause diarrheal illness. Cryptosporidium Antigen and/or Cyclospora and Isospora Exam may be ordered to detect these parasites. One negative sample does  not necessarily rule out  the presence of a parasitic infection.  For additional information, please refer to https://education.[facility].com/faq/FAQ203 (This link is being provided for informational/ educational purposes only.)       Assessment & Plan:   Problem List Items Addressed This Visit   None Visit Diagnoses       Acute non-recurrent frontal sinusitis    -  Primary   Relevant Medications   azithromycin  (ZITHROMAX  Z-PAK) 250 MG tablet   chlorpheniramine-HYDROcodone (TUSSIONEX) 10-8 MG/5ML   predniSONE  (DELTASONE ) 10 MG tablet     Bronchospasm, acute       Relevant Medications   albuterol  (VENTOLIN  HFA) 108 (90 Base) MCG/ACT inhaler   chlorpheniramine-HYDROcodone (TUSSIONEX) 10-8 MG/5ML   predniSONE  (DELTASONE ) 10 MG tablet        Persistent cough with postnasal drainage and possible post-infectious bronchospasm possibly triggered by a respiratory bug or allergy. Symptoms suggest bronchospasm, especially at night. Likely sinusitis component.  Given lack of improvement in 1 month with conservative course we will cover several options today - Prescribed empiric course azithromycin  Z-Pak: 2 pills on day 1, then 1 pill daily for 4 days. - Prescribed prednisone : 6-day taper (6, 5, 4, 3, 2, 1) for bronchospasm - Prescribed Tussionex cough syrup with hydrocodone. For symptom relief and sleep - Prescribed Albuterol  rescue inhaler for use during coughing spells.  Consider Flonase , but no drainage she will defer for now  Pause ibuprofen nightly for RLS while on prednisone .   No orders of the defined types were placed in this encounter.   Meds ordered this encounter  Medications   azithromycin  (ZITHROMAX  Z-PAK) 250 MG tablet    Sig: Take 2 tabs (500mg  total) on Day 1. Take 1 tab (250mg ) daily for next 4 days.     Dispense:  6 tablet    Refill:  0   albuterol  (VENTOLIN  HFA) 108 (90 Base) MCG/ACT inhaler    Sig: Inhale 2 puffs into the lungs every 6 (six) hours as needed for wheezing or shortness of breath.    Dispense:  8 g    Refill:  0   chlorpheniramine-HYDROcodone (TUSSIONEX) 10-8 MG/5ML    Sig: Take 5 mLs by mouth every 12 (twelve) hours as needed.    Dispense:  115 mL    Refill:  0   predniSONE  (DELTASONE ) 10 MG tablet    Sig: Take 6 tabs with breakfast Day 1, 5 tabs Day 2, 4 tabs Day 3, 3 tabs Day 4, 2 tabs Day 5, 1 tab Day 6.    Dispense:  21 tablet    Refill:  0    Follow up plan: Return if symptoms worsen or fail to improve.   Marsa Officer, DO [facility] 10/14/2023, 10:40 AM

## 2023-10-21 DIAGNOSIS — F418 Other specified anxiety disorders: Secondary | ICD-10-CM | POA: Diagnosis not present

## 2023-11-05 DIAGNOSIS — F418 Other specified anxiety disorders: Secondary | ICD-10-CM | POA: Diagnosis not present

## 2023-11-09 ENCOUNTER — Other Ambulatory Visit: Payer: Self-pay | Admitting: Medical Genetics

## 2023-11-10 DIAGNOSIS — F418 Other specified anxiety disorders: Secondary | ICD-10-CM | POA: Diagnosis not present

## 2023-11-17 DIAGNOSIS — Z01419 Encounter for gynecological examination (general) (routine) without abnormal findings: Secondary | ICD-10-CM | POA: Diagnosis not present

## 2023-11-17 DIAGNOSIS — Z1331 Encounter for screening for depression: Secondary | ICD-10-CM | POA: Diagnosis not present

## 2023-11-19 DIAGNOSIS — F418 Other specified anxiety disorders: Secondary | ICD-10-CM | POA: Diagnosis not present

## 2023-12-03 DIAGNOSIS — F418 Other specified anxiety disorders: Secondary | ICD-10-CM | POA: Diagnosis not present

## 2023-12-24 DIAGNOSIS — F418 Other specified anxiety disorders: Secondary | ICD-10-CM | POA: Diagnosis not present

## 2024-01-05 DIAGNOSIS — F418 Other specified anxiety disorders: Secondary | ICD-10-CM | POA: Diagnosis not present

## 2024-01-20 DIAGNOSIS — F418 Other specified anxiety disorders: Secondary | ICD-10-CM | POA: Diagnosis not present

## 2024-02-29 ENCOUNTER — Ambulatory Visit: Admitting: Internal Medicine

## 2024-03-01 ENCOUNTER — Other Ambulatory Visit

## 2024-03-02 ENCOUNTER — Encounter: Payer: Self-pay | Admitting: Internal Medicine

## 2024-03-02 ENCOUNTER — Ambulatory Visit: Admitting: Internal Medicine

## 2024-03-02 VITALS — BP 110/64 | Ht 66.0 in | Wt 164.4 lb

## 2024-03-02 DIAGNOSIS — F902 Attention-deficit hyperactivity disorder, combined type: Secondary | ICD-10-CM

## 2024-03-02 DIAGNOSIS — R220 Localized swelling, mass and lump, head: Secondary | ICD-10-CM

## 2024-03-02 DIAGNOSIS — Z6826 Body mass index (BMI) 26.0-26.9, adult: Secondary | ICD-10-CM

## 2024-03-02 DIAGNOSIS — F410 Panic disorder [episodic paroxysmal anxiety] without agoraphobia: Secondary | ICD-10-CM | POA: Diagnosis not present

## 2024-03-02 DIAGNOSIS — E663 Overweight: Secondary | ICD-10-CM

## 2024-03-02 DIAGNOSIS — F411 Generalized anxiety disorder: Secondary | ICD-10-CM

## 2024-03-02 MED ORDER — LORAZEPAM 0.5 MG PO TABS: 0.5000 mg | ORAL_TABLET | Freq: Every day | ORAL | 0 refills | Status: AC | PRN

## 2024-03-02 NOTE — Progress Notes (Signed)
 "   Subjective:    Patient ID: Laura Dodson, female    DOB: [birth year], 44 y.o.   MRN: [id number]  HPI  Patient presents to clinic today for follow-up of chronic conditions.  ADHD: She is not currently taking any medications for this.  She has failed adderall and strattera  in the past.  She liked vyvanse but did not like the side effects.  She is not currently seeing a psychiatrist.  Anxiety/panic attacks: Intermittently. She takes lorazepam  as needed with good results.  She would like this refilled today.  She is not currently seeing a therapist.  She denies depression, SI/HI.  Discussed the use of AI scribe software for clinical note transcription with the patient, who gave verbal consent to proceed.  Laura Dodson is a 44 year old female who presents with swelling on the left side of her neck.  She has experienced swelling on the left side of her neck for approximately five months. The swelling is accompanied by a 'gnawing kind of feeling.' She has been performing lymphatic massages and using lymphatic cream, which sometimes results in drainage, but the swelling persists.  She experiences intermittent ear pain located at the bottom of her ear canal on the left side. She reports no pain while chewing. She reports she does need some dental work, including crowns, but does not believe these are contributing to her current symptoms.  She mentions occasional dryness in her mouth, which she attributes to not drinking enough water and consuming coffee. No significant weight loss has been noted.    Review of Systems     Past Medical History:  Diagnosis Date   Anxiety     Current Outpatient Medications  Medication Sig Dispense Refill   albuterol  (VENTOLIN  HFA) 108 (90 Base) MCG/ACT inhaler Inhale 2 puffs into the lungs every 6 (six) hours as needed for wheezing or shortness of breath. 8 g 0   azithromycin  (ZITHROMAX  Z-PAK) 250 MG tablet Take 2 tabs (500mg  total) on Day 1. Take 1 tab  (250mg ) daily for next 4 days. 6 tablet 0   chlorpheniramine-HYDROcodone (TUSSIONEX) 10-8 MG/5ML Take 5 mLs by mouth every 12 (twelve) hours as needed. 115 mL 0   ELDERBERRY PO Take by mouth.     LORazepam  (ATIVAN ) 0.5 MG tablet TAKE 1 TABLET BY MOUTH EVERY DAY AS NEEDED FOR ANXIETY 30 tablet 0   predniSONE  (DELTASONE ) 10 MG tablet Take 6 tabs with breakfast Day 1, 5 tabs Day 2, 4 tabs Day 3, 3 tabs Day 4, 2 tabs Day 5, 1 tab Day 6. 21 tablet 0   triamcinolone ointment (KENALOG) 0.1 % Apply 1 Application topically 2 (two) times daily.     ZEPBOUND 2.5 MG/0.5ML Pen Inject 2.5 mg into the skin once a week.     No current facility-administered medications for this visit.    Allergies  Allergen Reactions   Buspar  [Buspirone ] Nausea Only    Family History  Problem Relation Age of Onset   Healthy Mother    Squamous cell carcinoma Mother    Atrial fibrillation Father    COPD Father    Heart failure Father    Healthy Brother    Healthy Daughter    Squamous cell carcinoma Maternal Aunt    Melanoma Maternal Grandmother    Lung cancer Maternal Grandmother    Cancer Paternal Grandfather    Healthy Brother     Social History   Socioeconomic History   Marital status: Married    Spouse name:  Not on file   Number of children: Not on file   Years of education: Not on file   Highest education level: Associate degree: occupational, technical, or vocational program  Occupational History   Not on file  Tobacco Use   Smoking status: Former    Current packs/day: 0.00    Average packs/day: 0.5 packs/day for 15.0 years (7.5 ttl pk-yrs)    Types: Cigarettes    Start date: 70    Quit date: 2012    Years since quitting: 14.0   Smokeless tobacco: Never  Vaping Use   Vaping status: Never Used  Substance and Sexual Activity   Alcohol use: Yes    Comment: 2-3   Drug use: No   Sexual activity: Not on file  Other Topics Concern   Not on file  Social History Narrative   Not on file    Social Drivers of Health   Tobacco Use: Medium Risk (10/14/2023)   Patient History    Smoking Tobacco Use: Former    Smokeless Tobacco Use: Never    Passive Exposure: Not on Actuary Strain: Not on file  Food Insecurity: Not on file  Transportation Needs: Not on file  Physical Activity: Not on file  Stress: Not on file  Social Connections: Unknown (07/02/2021)   [facility]   Social Network    Social Network: Not on file  Intimate Partner Violence: Unknown (05/24/2021)   Received from [facility]   HITS    Physically Hurt: Not on file    Insult or Talk Down To: Not on file    Threaten Physical Harm: Not on file    Scream or Curse: Not on file  Depression (PHQ2-9): High Risk (05/01/2022)   Depression (PHQ2-9)    PHQ-2 Score: 12  Alcohol Screen: Low Risk (05/01/2022)   Alcohol Screen    Last Alcohol Screening Score (AUDIT): 4  Housing: Not on file  Utilities: Not on file  Health Literacy: Not on file     Constitutional: Denies fever, malaise, fatigue, headache or abrupt weight changes.  HEENT: Patient reports left-sided facial swelling and ear pain.  Denies eye redness, eye pain, ringing in the ears, runny nose, nasal congestion or sore throat. Respiratory: Denies difficulty breathing, shortness of breath, cough or sputum production.   Cardiovascular: Denies chest pain, chest tightness, palpitations or swelling in the hands or feet.  Gastrointestinal: Denies abdominal pain, bloating, constipation, diarrhea or blood in the stool.  Neurological: Patient reports inattention.  Denies dizziness, difficulty with memory, difficulty with speech or problems with balance and coordination.  Psych: Patient has a history of anxiety.  Denies depression, SI/HI.  No other specific complaints in a complete review of systems (except as listed in HPI above).  Objective:   Physical Exam BP 110/64 (BP Location: Right Arm, Patient Position: Sitting, Cuff Size:  Normal)   Ht 5' 6 (1.676 m)   Wt 164 lb 6.4 oz (74.6 kg)   LMP 02/18/2024 (Exact Date)   BMI 26.53 kg/m    Wt Readings from Last 3 Encounters:  10/14/23 167 lb 8 oz (76 kg)  09/30/23 163 lb 8 oz (74.2 kg)  08/07/23 165 lb 9.6 oz (75.1 kg)    General: Appears her stated age, well developed, well nourished in NAD. HEENT: Asymmetrical buccal fat noted on the left side of the face, concerning for buccal lipoma.  Throat: She does have 1 missing tooth on the left lower jaw however no evidence  of gingival swelling noted Cardiovascular: Normal rate and rhythm. Pulmonary/Chest: Normal effort.  No respiratory distress Neurological: Alert and oriented. Coordination normal.  Psychiatric: Mood and affect is normal.  Behavior is normal. Judgment and thought content normal.     BMET    Component Value Date/Time   NA 138 12/26/2022 1532   K 4.1 12/26/2022 1532   CL 107 12/26/2022 1532   CO2 21 12/26/2022 1532   GLUCOSE 98 12/26/2022 1532   BUN 21 12/26/2022 1532   CREATININE 0.71 12/26/2022 1532   CALCIUM 8.8 12/26/2022 1532   GFRNONAA 109 09/16/2019 1028   GFRAA 126 09/16/2019 1028    Lipid Panel     Component Value Date/Time   CHOL 172 12/26/2022 1532   TRIG 78 12/26/2022 1532   HDL 68 12/26/2022 1532   CHOLHDL 2.5 12/26/2022 1532   LDLCALC 87 12/26/2022 1532    CBC    Component Value Date/Time   WBC 6.4 12/26/2022 1532   RBC 4.13 12/26/2022 1532   HGB 12.9 12/26/2022 1532   HCT 39.5 12/26/2022 1532   PLT 210 12/26/2022 1532   MCV 95.6 12/26/2022 1532   MCH 31.2 12/26/2022 1532   MCHC 32.7 12/26/2022 1532   RDW 12.1 12/26/2022 1532   LYMPHSABS 1,780 09/16/2019 1028   EOSABS 60 09/16/2019 1028   BASOSABS 18 09/16/2019 1028    Hgb A1C Lab Results  Component Value Date   HGBA1C 5.3 12/26/2022             Assessment & Plan:   Assessment and Plan    Asymmetrical facial swelling, concern for buccal lipoma Swelling and asymmetry on the left neck with a  palpable, soft, movable mass, likely a lipoma. Differential includes cyst or benign tumor. No malignancy signs. - Ordered CT soft tissue of the face to confirm diagnosis. - Coordinated with radiology for CT scan scheduling.     Schedule an appointment for your annual exam Angeline Laura, NP  "

## 2024-03-02 NOTE — Assessment & Plan Note (Signed)
 Encouraged diet and exercise for weight loss ?

## 2024-03-02 NOTE — Assessment & Plan Note (Signed)
 Not medicated We will monitor

## 2024-03-02 NOTE — Assessment & Plan Note (Signed)
 Continue lorazepam  0.5 mg daily. Advised her to avoid taking lorazepam  daily and to take as needed only for severe anxiety Support offered

## 2024-03-02 NOTE — Patient Instructions (Signed)
 Lipoma  A lipoma is a noncancerous (benign) tumor that is made up of fat cells. This is a very common type of soft-tissue growth. Lipomas are usually found under the skin (subcutaneous). They may occur in any tissue of the body that contains fat. Common areas for lipomas to appear include the back, arms, shoulders, buttocks, and thighs. Lipomas grow slowly, and they are usually painless. Most lipomas do not cause problems and do not require treatment. What are the causes? The cause of this condition is not known. What increases the risk? You are more likely to develop this condition if: You are 18-42 years old. You have a family history of lipomas. What are the signs or symptoms? A lipoma usually appears as a small, round bump under the skin. In most cases, the lump will: Feel soft or rubbery. Not cause pain or other symptoms. However, if a lipoma is located in an area where it pushes on nerves, it can become painful or cause other symptoms. How is this diagnosed? A lipoma can usually be diagnosed with a physical exam. You may also have tests to confirm the diagnosis and to rule out other conditions. Tests may include: Imaging tests, such as a CT scan or an MRI. Removal of a tissue sample to be looked at under a microscope (biopsy). How is this treated? Treatment for this condition depends on the size of the lipoma and whether it is causing any symptoms. For small lipomas that are not causing problems, no treatment is needed. If a lipoma is bigger or it causes problems, surgery may be done to remove the lipoma. Lipomas can also be removed to improve appearance. Most often, the procedure is done after applying a medicine that numbs the area (local anesthetic). Liposuction may be done to reduce the size of the lipoma before it is removed through surgery, or it may be done to remove the lipoma. Lipomas are removed with this method to limit incision size and scarring. A liposuction tube is  inserted through a small incision into the lipoma, and the contents of the lipoma are removed through the tube with suction. Follow these instructions at home: Watch your lipoma for any changes. Keep all follow-up visits. This is important. Where to find more information OrthoInfo: orthoinfo.aaos.org Contact a health care provider if: Your lipoma becomes larger or hard. Your lipoma becomes painful, red, or increasingly swollen. These could be signs of infection or a more serious condition. Get help right away if: You develop tingling or numbness in an area near the lipoma. This could indicate that the lipoma is causing nerve damage. Summary A lipoma is a noncancerous tumor that is made up of fat cells. Most lipomas do not cause problems and do not require treatment. If a lipoma is bigger or it causes problems, surgery may be done to remove the lipoma. Contact a health care provider if your lipoma becomes larger or hard, or if it becomes painful, red, or increasingly swollen. These could be signs of infection or a more serious condition. This information is not intended to replace advice given to you by your health care provider. Make sure you discuss any questions you have with your health care provider. Document Revised: 02/22/2021 Document Reviewed: 02/22/2021 Elsevier Patient Education  2024 [employer].

## 2024-03-03 ENCOUNTER — Other Ambulatory Visit
Admission: RE | Admit: 2024-03-03 | Discharge: 2024-03-03 | Disposition: A | Discharge status: Home / Self Care | Payer: Self-pay | Source: Ambulatory Visit | Attending: Medical Genetics | Admitting: Medical Genetics

## 2024-03-03 ENCOUNTER — Ambulatory Visit
Admission: RE | Admit: 2024-03-03 | Discharge: 2024-03-03 | Disposition: A | Discharge status: Home / Self Care | Source: Ambulatory Visit | Attending: Internal Medicine | Admitting: Internal Medicine

## 2024-03-03 DIAGNOSIS — R220 Localized swelling, mass and lump, head: Secondary | ICD-10-CM | POA: Insufficient documentation

## 2024-03-09 ENCOUNTER — Encounter: Payer: Self-pay | Admitting: Internal Medicine

## 2024-03-09 ENCOUNTER — Ambulatory Visit: Payer: Self-pay | Admitting: Internal Medicine

## 2024-03-09 ENCOUNTER — Ambulatory Visit: Admitting: Internal Medicine

## 2024-03-09 VITALS — BP 110/68 | Ht 66.0 in | Wt 164.6 lb

## 2024-03-09 DIAGNOSIS — Z6826 Body mass index (BMI) 26.0-26.9, adult: Secondary | ICD-10-CM | POA: Diagnosis not present

## 2024-03-09 DIAGNOSIS — Z1211 Encounter for screening for malignant neoplasm of colon: Secondary | ICD-10-CM | POA: Diagnosis not present

## 2024-03-09 DIAGNOSIS — R739 Hyperglycemia, unspecified: Secondary | ICD-10-CM

## 2024-03-09 DIAGNOSIS — N644 Mastodynia: Secondary | ICD-10-CM | POA: Diagnosis not present

## 2024-03-09 DIAGNOSIS — Z0001 Encounter for general adult medical examination with abnormal findings: Secondary | ICD-10-CM

## 2024-03-09 DIAGNOSIS — E663 Overweight: Secondary | ICD-10-CM

## 2024-03-09 DIAGNOSIS — Z136 Encounter for screening for cardiovascular disorders: Secondary | ICD-10-CM

## 2024-03-09 MED ORDER — ZEPBOUND 2.5 MG/0.5ML ~~LOC~~ SOAJ: 2.5000 mg | SUBCUTANEOUS | 0 refills | Status: AC

## 2024-03-09 MED ORDER — ZEPBOUND 2.5 MG/0.5ML ~~LOC~~ SOAJ: 2.5000 mg | SUBCUTANEOUS | 0 refills | Status: DC

## 2024-03-09 NOTE — Patient Instructions (Signed)

## 2024-03-09 NOTE — Addendum Note (Signed)
 Addended by: ZELIA GAUZE D on: 03/09/2024 11:00 AM   Modules accepted: Orders

## 2024-03-09 NOTE — Assessment & Plan Note (Signed)
 Encouraged diet and exercise for weight loss ?

## 2024-03-09 NOTE — Progress Notes (Signed)
 "  Subjective:    Patient ID: Laura Dodson, female    DOB: 18-Mar-1980, 44 y.o.   MRN: [id number]  HPI  Patient presents to clinic today for her annual exam.  Flu: Never Tetanus: 08/2020 COVID: Never Pap smear: 09/2021 Mammogram: 09/2023 Colon screening: Never Vision screening: annually Dentist: biannually  Diet: She does eat meat. She consumes fruits and veggies. She tries to avoid fried foods. She drinks mostly water, coffee. Exercise: Walking, weights   Review of Systems     Past Medical History:  Diagnosis Date   Anxiety     Current Outpatient Medications  Medication Sig Dispense Refill   albuterol  (VENTOLIN  HFA) 108 (90 Base) MCG/ACT inhaler Inhale 2 puffs into the lungs every 6 (six) hours as needed for wheezing or shortness of breath. 8 g 0   ELDERBERRY PO Take by mouth.     LORazepam  (ATIVAN ) 0.5 MG tablet Take 1 tablet (0.5 mg total) by mouth daily as needed for anxiety. 30 tablet 0   ZEPBOUND  2.5 MG/0.5ML Pen Inject 2.5 mg into the skin once a week.     No current facility-administered medications for this visit.    Allergies  Allergen Reactions   Buspar  [Buspirone ] Nausea Only    Family History  Problem Relation Age of Onset   Healthy Mother    Squamous cell carcinoma Mother    Atrial fibrillation Father    COPD Father    Heart failure Father    Healthy Brother    Healthy Daughter    Squamous cell carcinoma Maternal Aunt    Melanoma Maternal Grandmother    Lung cancer Maternal Grandmother    Cancer Paternal Grandfather    Healthy Brother     Social History   Socioeconomic History   Marital status: Married    Spouse name: Not on file   Number of children: Not on file   Years of education: Not on file   Highest education level: Associate degree: occupational, scientist, product/process development, or vocational program  Occupational History   Not on file  Tobacco Use   Smoking status: Former    Current packs/day: 0.00    Average packs/day: 0.5 packs/day for 15.0  years (7.5 ttl pk-yrs)    Types: Cigarettes    Start date: 64    Quit date: 2012    Years since quitting: 14.0   Smokeless tobacco: Never  Vaping Use   Vaping status: Never Used  Substance and Sexual Activity   Alcohol use: Yes    Comment: 2-3   Drug use: No   Sexual activity: Not on file  Other Topics Concern   Not on file  Social History Narrative   Not on file   Social Drivers of Health   Tobacco Use: Medium Risk (03/02/2024)   Patient History    Smoking Tobacco Use: Former    Smokeless Tobacco Use: Never    Passive Exposure: Not on Actuary Strain: Low Risk (03/02/2024)   Overall Financial Resource Strain (CARDIA)    Difficulty of Paying Living Expenses: Not hard at all  Food Insecurity: No Food Insecurity (03/02/2024)   Epic    Worried About Programme Researcher, Broadcasting/film/video in the Last Year: Never true    Ran Out of Food in the Last Year: Never true  Transportation Needs: No Transportation Needs (03/02/2024)   Epic    Lack of Transportation (Medical): No    Lack of Transportation (Non-Medical): No  Physical Activity: Insufficiently Active (03/02/2024)   Exercise  Vital Sign    Days of Exercise per Week: 3 days    Minutes of Exercise per Session: 30 min  Stress: Stress Concern Present (03/02/2024)   [employer] of Occupational Health - Occupational Stress Questionnaire    Feeling of Stress: To some extent  Social Connections: Moderately Integrated (03/02/2024)   Social Connection and Isolation Panel    Frequency of Communication with Friends and Family: More than three times a week    Frequency of Social Gatherings with Friends and Family: Twice a week    Attends Religious Services: More than 4 times per year    Active Member of [employer] or Organizations: No    Attends Engineer, Structural: Not on file    Marital Status: Married  Intimate Partner Violence: Unknown (05/24/2021)   Received from [facility]   HITS    Physically Hurt: Not on file     Insult or Talk Down To: Not on file    Threaten Physical Harm: Not on file    Scream or Curse: Not on file  Depression (PHQ2-9): Low Risk (03/02/2024)   Depression (PHQ2-9)    PHQ-2 Score: 0  Alcohol Screen: Low Risk (03/02/2024)   Alcohol Screen    Last Alcohol Screening Score (AUDIT): 2  Housing: Low Risk (03/02/2024)   Epic    Unable to Pay for Housing in the Last Year: No    Number of Times Moved in the Last Year: 0    Homeless in the Last Year: No  Utilities: Not on file  Health Literacy: Not on file     Constitutional: Denies fever, malaise, fatigue, headache or abrupt weight changes.  HEENT: Denies eye pain, eye redness, ear pain, ringing in the ears, wax buildup, runny nose, nasal congestion, bloody nose, or sore throat. Respiratory: Denies difficulty breathing, shortness of breath, cough or sputum production.   Cardiovascular: Denies chest pain, chest tightness, palpitations or swelling in the hands or feet.  Gastrointestinal:  Denies abdominal pain, bloating, constipation, diarrhea or blood in the stool.  GU: Denies urgency, frequency, pain with urination, burning sensation, blood in urine, odor or discharge. Musculoskeletal: Denies decrease in range of motion, difficulty with gait, muscle pain or joint pain and swelling.  Skin: Denies redness, rashes, lesions or ulcercations.  Neurological: Patient reports inattention.  Denies dizziness, difficulty with memory, difficulty with speech or problems with balance and coordination.  Psych: Patient is a history of anxiety.  Denies depression, SI/HI.  No other specific complaints in a complete review of systems (except as listed in HPI above).  Objective:   Physical Exam BP 110/68 (BP Location: Left Arm, Patient Position: Sitting, Cuff Size: Normal)   Ht 5' 6 (1.676 m)   Wt 164 lb 9.6 oz (74.7 kg)   LMP 02/18/2024 (Exact Date)   BMI 26.57 kg/m    Wt Readings from Last 3 Encounters:  03/02/24 164 lb 6.4 oz (74.6 kg)   10/14/23 167 lb 8 oz (76 kg)  09/30/23 163 lb 8 oz (74.2 kg)    General: Appears her stated age, overweight, in NAD. Skin: Warm, dry and intact.  HEENT: Head: normal shape and size; Eyes: sclera white, no icterus, conjunctiva pink, PERRLA and EOMs intact;  Neck:  Neck supple, trachea midline. No masses, lumps or thyromegaly present.  Cardiovascular: Normal rate and rhythm. S1,S2 noted.  No murmur, rubs or gallops noted. No JVD or BLE edema.  Pulmonary/Chest: Normal effort and positive vesicular breath sounds. No respiratory distress. No wheezes,  rales or ronchi noted.  Abdomen: Soft and nontender. Normal bowel sounds.  Musculoskeletal: Strength 5/5 BUE/BLE. No difficulty with gait.  Neurological: Alert and oriented. Cranial nerves II-XII grossly intact. Coordination normal.  Psychiatric: Mood and affect normal. Behavior is normal. Judgment and thought content normal.     BMET    Component Value Date/Time   NA 138 12/26/2022 1532   K 4.1 12/26/2022 1532   CL 107 12/26/2022 1532   CO2 21 12/26/2022 1532   GLUCOSE 98 12/26/2022 1532   BUN 21 12/26/2022 1532   CREATININE 0.71 12/26/2022 1532   CALCIUM 8.8 12/26/2022 1532   GFRNONAA 109 09/16/2019 1028   GFRAA 126 09/16/2019 1028    Lipid Panel     Component Value Date/Time   CHOL 172 12/26/2022 1532   TRIG 78 12/26/2022 1532   HDL 68 12/26/2022 1532   CHOLHDL 2.5 12/26/2022 1532   LDLCALC 87 12/26/2022 1532    CBC    Component Value Date/Time   WBC 6.4 12/26/2022 1532   RBC 4.13 12/26/2022 1532   HGB 12.9 12/26/2022 1532   HCT 39.5 12/26/2022 1532   PLT 210 12/26/2022 1532   MCV 95.6 12/26/2022 1532   MCH 31.2 12/26/2022 1532   MCHC 32.7 12/26/2022 1532   RDW 12.1 12/26/2022 1532   LYMPHSABS 1,780 09/16/2019 1028   EOSABS 60 09/16/2019 1028   BASOSABS 18 09/16/2019 1028    Hgb A1C Lab Results  Component Value Date   HGBA1C 5.3 12/26/2022           Assessment & Plan:   Preventative health  maintenance:  She declines flu shot Tetanus UTD Encouraged her to get her COVID-vaccine Pap smear UTD Mammogram ordered by GYN Cologuard ordered Encouraged to consume a balanced diet and exercise regimen Advised her to see an eye doctor and dentist annually Will check CBC, c-Met, TSH, lipid, A1c today    RTC in 6 months, follow-up chronic conditions Angeline Laura, NP  "

## 2024-03-10 ENCOUNTER — Ambulatory Visit: Payer: Self-pay | Admitting: Internal Medicine

## 2024-03-13 LAB — GENECONNECT MOLECULAR SCREEN: Genetic Analysis Overall Interpretation: NEGATIVE

## 2024-03-15 LAB — COMPREHENSIVE METABOLIC PANEL WITH GFR
AG Ratio: 1.5 (calc) (ref 1.0–2.5)
ALT: 11 U/L (ref 6–29)
AST: 17 U/L (ref 10–35)
Albumin: 4.2 g/dL (ref 3.6–5.1)
Alkaline phosphatase (APISO): 35 U/L (ref 31–125)
BUN: 16 mg/dL (ref 7–25)
CO2: 25 mmol/L (ref 20–32)
Calcium: 8.7 mg/dL (ref 8.6–10.2)
Chloride: 107 mmol/L (ref 98–110)
Creat: 0.69 mg/dL (ref 0.50–0.99)
Globulin: 2.8 g/dL (ref 1.9–3.7)
Glucose, Bld: 91 mg/dL (ref 65–139)
Potassium: 4.2 mmol/L (ref 3.5–5.3)
Sodium: 139 mmol/L (ref 135–146)
Total Bilirubin: 0.4 mg/dL (ref 0.2–1.2)
Total Protein: 7 g/dL (ref 6.1–8.1)
eGFR: 109 mL/min/{1.73_m2}

## 2024-03-15 LAB — HEMOGLOBIN A1C
Hgb A1c MFr Bld: 5 %
Mean Plasma Glucose: 97 mg/dL
eAG (mmol/L): 5.4 mmol/L

## 2024-03-15 LAB — PROGESTERONE: Progesterone: 3.5 ng/mL

## 2024-03-15 LAB — LIPID PANEL
Cholesterol: 165 mg/dL
HDL: 67 mg/dL
LDL Cholesterol (Calc): 80 mg/dL
Non-HDL Cholesterol (Calc): 98 mg/dL
Total CHOL/HDL Ratio: 2.5 (calc)
Triglycerides: 101 mg/dL

## 2024-03-15 LAB — CBC
HCT: 37.4 % (ref 35.9–46.0)
Hemoglobin: 12.4 g/dL (ref 11.7–15.5)
MCH: 31.2 pg (ref 27.0–33.0)
MCHC: 33.2 g/dL (ref 31.6–35.4)
MCV: 94 fL (ref 81.4–101.7)
MPV: 10.8 fL (ref 7.5–12.5)
Platelets: 242 10*3/uL (ref 140–400)
RBC: 3.98 Million/uL (ref 3.80–5.10)
RDW: 12.6 % (ref 11.0–15.0)
WBC: 5.8 10*3/uL (ref 3.8–10.8)

## 2024-03-15 LAB — ESTROGENS, TOTAL: Estrogen: 131 pg/mL

## 2024-03-15 LAB — ESTRADIOL: Estradiol: 57 pg/mL

## 2024-03-15 LAB — TESTOSTERONE, FREE: TESTOSTERONE FREE: 0.9 pg/mL (ref 0.2–5.0)

## 2024-03-15 LAB — TSH: TSH: 1.25 m[IU]/L

## 2024-09-12 ENCOUNTER — Ambulatory Visit: Admitting: Internal Medicine
